# Patient Record
Sex: Female | Born: 1965 | Race: White | Hispanic: No | Smoking: Never smoker
Health system: Southern US, Community
[De-identification: ages and names within clinical notes are randomized; demographics above are authoritative.]

## PROBLEM LIST (undated history)

## (undated) HISTORY — PX: SHOULDER ARTHROSCOPY: SHX128

## (undated) HISTORY — PX: TONSILLECTOMY: SUR1361

---

## 2016-07-03 ENCOUNTER — Emergency Department (HOSPITAL_BASED_OUTPATIENT_CLINIC_OR_DEPARTMENT_OTHER): Payer: BC Managed Care – PPO

## 2016-07-03 ENCOUNTER — Emergency Department (HOSPITAL_BASED_OUTPATIENT_CLINIC_OR_DEPARTMENT_OTHER)
Admission: EM | Admit: 2016-07-03 | Discharge: 2016-07-03 | Disposition: A | Discharge status: Home / Self Care | Payer: BC Managed Care – PPO | Attending: Emergency Medicine | Admitting: Emergency Medicine

## 2016-07-03 ENCOUNTER — Encounter (HOSPITAL_BASED_OUTPATIENT_CLINIC_OR_DEPARTMENT_OTHER): Payer: Self-pay

## 2016-07-03 DIAGNOSIS — S8261XA Displaced fracture of lateral malleolus of right fibula, initial encounter for closed fracture: Secondary | ICD-10-CM | POA: Diagnosis not present

## 2016-07-03 DIAGNOSIS — Y999 Unspecified external cause status: Secondary | ICD-10-CM | POA: Diagnosis not present

## 2016-07-03 DIAGNOSIS — Y929 Unspecified place or not applicable: Secondary | ICD-10-CM | POA: Diagnosis not present

## 2016-07-03 DIAGNOSIS — S82891A Other fracture of right lower leg, initial encounter for closed fracture: Secondary | ICD-10-CM

## 2016-07-03 DIAGNOSIS — Y939 Activity, unspecified: Secondary | ICD-10-CM | POA: Insufficient documentation

## 2016-07-03 DIAGNOSIS — X501XXA Overexertion from prolonged static or awkward postures, initial encounter: Secondary | ICD-10-CM | POA: Diagnosis not present

## 2016-07-03 DIAGNOSIS — S99911A Unspecified injury of right ankle, initial encounter: Secondary | ICD-10-CM | POA: Diagnosis present

## 2016-07-03 MED ORDER — IBUPROFEN 800 MG PO TABS
800.0000 mg | ORAL_TABLET | Freq: Once | ORAL | Status: AC
  Administered 2016-07-03: 800 mg via ORAL
  Filled 2016-07-03: qty 1

## 2016-07-03 MED ORDER — ACETAMINOPHEN 500 MG PO TABS
1000.0000 mg | ORAL_TABLET | Freq: Once | ORAL | Status: AC
  Administered 2016-07-03: 1000 mg via ORAL
  Filled 2016-07-03: qty 2

## 2016-07-03 NOTE — ED Triage Notes (Signed)
Right ankle injury 1230pm today-NAD-slow limping gait

## 2016-07-03 NOTE — ED Provider Notes (Signed)
[facility]   CSN: [id number] Arrival date & time: 07/03/16  2009  By signing my name below, I, Linna Darner, attest that this documentation has been prepared under the direction and in the presence of physician practitioner, Melene Plan, DO. Electronically Signed: Linna Darner, Scribe. 07/03/2016. 8:25 PM.  History   Chief Complaint Chief Complaint  Patient presents with  . Ankle Injury   The history is provided by the patient. No language interpreter was used.  Ankle Injury  This is a new problem. The current episode started 1 to 2 hours ago. The problem occurs constantly. The problem has not changed since onset.Pertinent negatives include no chest pain, no abdominal pain, no headaches and no shortness of breath. The symptoms are aggravated by walking and standing. Nothing relieves the symptoms. She has tried nothing for the symptoms.    HPI Comments: Megan Mclean is a 51 y.o. female who presents to the Emergency Department complaining of a lateral right ankle injury sustained shortly PTA. She states she accidentally stepped in a hole and everted her right ankle. No falls. She endorses some associated swelling. She states her pain is worse with weight bearing and applied pressure to her lateral right ankle. No alleviating factors noted. She denies numbness/tingling, wounds, or any other associated symptoms.  History reviewed. No pertinent past medical history.  There are no active problems to display for this patient.   Past Surgical History:  Procedure Laterality Date  . CESAREAN SECTION      OB History    No data available       Home Medications    Prior to Admission medications   Not on File    Family History No family history on file.  Social History Social History  Substance Use Topics  . Smoking status: Never Smoker  . Smokeless tobacco: Never Used  . Alcohol use No     Allergies   Codeine   Review of Systems Review of  Systems  Respiratory: Negative for shortness of breath.   Cardiovascular: Negative for chest pain.  Gastrointestinal: Negative for abdominal pain.  Musculoskeletal: Positive for arthralgias and joint swelling.  Skin: Negative for wound.  Neurological: Negative for numbness and headaches.  All other systems reviewed and are negative.  Physical Exam Updated Vital Signs BP 133/89 (BP Location: Left Arm)   Pulse 97   Temp 98.3 F (36.8 C) (Oral)   Resp 20   Ht  (1.626 m)   Wt 170 lb (77.1 kg)   SpO2 100%   BMI 29.18 kg/m   Physical Exam  Constitutional: She is oriented to person, place, and time. She appears well-developed and well-nourished. No distress.  HENT:  Head: Normocephalic and atraumatic.  Eyes: EOM are normal. Pupils are equal, round, and reactive to light.  Neck: Normal range of motion. Neck supple.  Cardiovascular: Normal rate and regular rhythm.  Exam reveals no gallop and no friction rub.   No murmur heard. Pulmonary/Chest: Effort normal. She has no wheezes. She has no rales.  Abdominal: Soft. She exhibits no distension. There is no tenderness.  Musculoskeletal: She exhibits tenderness.  Pain about the right lateral malleolus. Focal swelling just inferior and posterior to the malleolus. Pain is worse about the attachment of the ATF. No pain to the base of the fifth metatarsal or the cuboid. No fibular neck tenderness.  Neurological: She is alert and oriented to person, place, and time.  Skin: Skin is warm and dry. She is  not diaphoretic.  Psychiatric: She has a normal mood and affect. Her behavior is normal.  Nursing note and vitals reviewed.  ED Treatments / Results  Labs (all labs ordered are listed, but only abnormal results are displayed) Labs Reviewed - No data to display  EKG  EKG Interpretation None       Radiology Dg Ankle Complete Right  Result Date: 07/03/2016 CLINICAL DATA:  Twisted ankle today, lateral pain and swelling. EXAM: RIGHT  ANKLE - COMPLETE 3+ VIEW COMPARISON:  None. FINDINGS: No dislocation. Slight cortical irregularity of the lateral malleolar tip. The ankle mortise appears congruent and the tibiofibular syndesmosis intact. No destructive bony lesions. Mild lateral hindfoot soft tissue swelling without subcutaneous gas or radiopaque foreign bodies. IMPRESSION: Slight cortical irregularity of the lateral malleolar tip equivocal for acute nondisplaced avulsion fracture. Electronically Signed   By: Awilda Metro M.D.   On: 07/03/2016 20:53    Procedures Procedures (including critical care time)  DIAGNOSTIC STUDIES: Oxygen Saturation is 100% on RA, normal by my interpretation.    COORDINATION OF CARE: 8:29 PM Discussed treatment plan with pt at bedside and pt agreed to plan.  Medications Ordered in ED Medications  acetaminophen (TYLENOL) tablet 1,000 mg (1,000 mg Oral Given 07/03/16 2047)  ibuprofen (ADVIL,MOTRIN) tablet 800 mg (800 mg Oral Given 07/03/16 2047)     Initial Impression / Assessment and Plan / ED Course  I have reviewed the triage vital signs and the nursing notes.  Pertinent labs & imaging results that were available during my care of the patient were reviewed by me and considered in my medical decision making (see chart for details).     52 yo F With a chief complaint of right ankle pain. This occurred after an inversion injury when she stepped into a hole. Patient has swelling focally just inferior and posterior to the lateral malleolus. Pain at the attachment of the ATF. X-ray consistent with ligamentous injury there is a small possible chip fracture were expect the attachment to be. We'll place the patient in a removable splint. Given crutches. PCP follow-up.  9:06 PM:  I have discussed the diagnosis/risks/treatment options with the patient and believe the pt to be eligible for discharge home to follow-up with PCP. We also discussed returning to the ED immediately if new or worsening sx  occur. We discussed the sx which are most concerning (e.g., sudden worsening pain, fever, inability to tolerate by mouth) that necessitate immediate return. Medications administered to the patient during their visit and any new prescriptions provided to the patient are listed below.  Medications given during this visit Medications  acetaminophen (TYLENOL) tablet 1,000 mg (1,000 mg Oral Given 07/03/16 2047)  ibuprofen (ADVIL,MOTRIN) tablet 800 mg (800 mg Oral Given 07/03/16 2047)     The patient appears reasonably screen and/or stabilized for discharge and I doubt any other medical condition or other [facility] requiring further screening, evaluation, or treatment in the ED at this time prior to discharge.    Final Clinical Impressions(s) / ED Diagnoses   Final diagnoses:  Closed fracture of malleolus of right ankle, initial encounter    New Prescriptions New Prescriptions   No medications on file    I personally performed the services described in this documentation, which was scribed in my presence. The recorded information has been reviewed and is accurate.    Melene Plan, DO 07/03/16 2106

## 2016-07-03 NOTE — Discharge Instructions (Signed)
Take 4 over the counter ibuprofen tablets 3 times a day or 2 over-the-counter naproxen tablets twice a day for pain. Also take tylenol 1000mg(2 extra strength) four times a day.    

## 2016-10-07 ENCOUNTER — Ambulatory Visit (INDEPENDENT_AMBULATORY_CARE_PROVIDER_SITE_OTHER): Payer: BC Managed Care – PPO | Admitting: Orthopaedic Surgery

## 2016-10-07 ENCOUNTER — Ambulatory Visit (INDEPENDENT_AMBULATORY_CARE_PROVIDER_SITE_OTHER): Payer: Self-pay

## 2016-10-07 ENCOUNTER — Encounter (INDEPENDENT_AMBULATORY_CARE_PROVIDER_SITE_OTHER): Payer: Self-pay | Admitting: Orthopaedic Surgery

## 2016-10-07 VITALS — BP 129/81 | HR 98 | Resp 14 | Ht 65.0 in | Wt 165.0 lb

## 2016-10-07 DIAGNOSIS — M542 Cervicalgia: Secondary | ICD-10-CM

## 2016-10-07 DIAGNOSIS — G8929 Other chronic pain: Secondary | ICD-10-CM

## 2016-10-07 DIAGNOSIS — M25512 Pain in left shoulder: Secondary | ICD-10-CM | POA: Diagnosis not present

## 2016-10-07 NOTE — Progress Notes (Signed)
Office Visit Note   Patient: Megan Mclean           Date of Birth: 1965/05/26           MRN: [id number] Visit Date: 10/07/2016              Requested by: [place], [place], [place] [address] [place], [place] [address] PCP: Piedad Climes, [place], [place]   Assessment & Plan: Visit Diagnoses: Left shoulder pain with evidence of calcific tendinitis. Positive impingement  Plan: MRI scan left shoulder. Return shortly thereafter. Suggest a formal chest x-ray to be performed to her primary care physician's office with evidence of scarring in the left upper lobe prior C-spine films. Follow up with ENT physicians regarding persistent dizziness and/or consider referral to neurologist. Discussed all the above with Megan Mclean  Follow-Up Instructions: No Follow-up on file.   Orders:  No orders of the defined types were placed in this encounter.  No orders of the defined types were placed in this encounter.     Procedures: No procedures performed   Clinical Data: No additional findings.   Subjective: Chief Complaint  Patient presents with  . Left Shoulder - Pain, Weakness    Megan Mclean is a 51 y o that presents with chronic Left shoulder pain x 2 yrs. Seen at [facility], frozen shoulder, gave in  's lemons visits the office today for essentially a second opinion regarding a chronic problem she is having with her left shoulder. She has been evaluated by an orthopedist in [facility]. Cortisone injection was "uncomfortable" but did not provide her with any significant relief. Pain is somewhat nondescript but oftentimes associated with overhead activity and lying on that side. She also was had an issue with her cervical spine associated with vertigo. She's been evaluated by her primary care physician and an ENT physician with physical therapy. She no longer has the vertigo but still is having some "dizziness". She does not experience numbness or tingling. She does work as a Runner, broadcasting/film/video  and is concerned about her returning to work in the near future with all of the above. She is really not sure where to turn and how problem with her neck and her shoulder may or may not be related. He's had some stiffness and achiness about the cervical spine was some referred discomfort along the left scapula. She's not sure that any motion of her neck refers the pain to the anterior aspect of her left shoulder where she is having more discomfort. She denies any fever or chills. She's not had any specific injury. Notes from her orthopedist and are reviewed. She's had prior x-rays revealing what they thought might be a loose body. No further diagnostic studies have been ordered this point. I did review the films performed in [facility]. I believe that that calcified area is a calcific tendinitis involving the supraspinatus. There are mild degenerative changes at the acromioclavicular joint. The humeral head is centered about the glenoid  HPI  Review of Systems  Constitutional: Negative for chills, fatigue and fever.  Eyes: Negative for itching.  Respiratory: Negative for chest tightness and shortness of breath.   Cardiovascular: Negative for chest pain, palpitations and leg swelling.  Gastrointestinal: Negative for blood in stool, constipation and diarrhea.  Musculoskeletal: Negative for back pain, joint swelling, neck pain and neck stiffness.  Neurological: Positive for dizziness. Negative for numbness.  Hematological: Does not bruise/bleed easily.  Psychiatric/Behavioral: The patient is not nervous/anxious.  Objective: Vital Signs: BP 129/81   Pulse 98   Resp 14   Ht 5\' 5"  (1.651 m)   Wt 165 lb (74.8 kg)   BMI 27.46 kg/m   Physical Exam  Ortho Exam left shoulder with positive impingement on the extremes of internal/external rotation. Minimally positive empty can test. Able to raise her left arm fully overhead but with a circuitous motion. Some tenderness along the anterior and  lateral subacromial region. Skin intact. Biceps intact. Good grip and good release. Reflexes symmetrical. Some limitation of the cervical spine in flexion extension and rotation loss is mild. Some referred pain to the left scapula with motion and to the posterior aspect of the cervical spine. With rotation of the neck to the to the right there was some discomfort to the shoulder. Some areas of tenderness about the left scapula but  no mass. Pain about the left shoulder and specifically supraspinatus was mild.  Specialty Comments:  No specialty comments available.  Imaging: No results found.   PMFS History: There are no active problems to display for this patient.  History reviewed. No pertinent past medical history.  Family History  Problem Relation Age of Onset  . Cancer Sister   . Osteosarcoma Sister   . Osteosarcoma Son     Past Surgical History:  Procedure Laterality Date  . CESAREAN SECTION     Social History   Occupational History  . Not on file.   Social History Main Topics  . Smoking status: Never Smoker  . Smokeless tobacco: Never Used  . Alcohol use No  . Drug use: No  . Sexual activity: Not on file

## 2016-10-09 ENCOUNTER — Telehealth (INDEPENDENT_AMBULATORY_CARE_PROVIDER_SITE_OTHER): Payer: Self-pay | Admitting: Orthopaedic Surgery

## 2016-10-09 NOTE — Telephone Encounter (Signed)
Patient wants to check on status of MRI, and Chest Xray that was suppose to be ordered. Patient hasn't heard anything. Please call to advise.

## 2016-10-10 NOTE — Telephone Encounter (Signed)
scheduled

## 2016-10-12 ENCOUNTER — Other Ambulatory Visit: Payer: BC Managed Care – PPO

## 2016-11-01 ENCOUNTER — Ambulatory Visit
Admission: RE | Admit: 2016-11-01 | Discharge: 2016-11-01 | Disposition: A | Discharge status: Home / Self Care | Payer: BC Managed Care – PPO | Source: Ambulatory Visit | Attending: Orthopaedic Surgery | Admitting: Orthopaedic Surgery

## 2016-11-01 DIAGNOSIS — G8929 Other chronic pain: Secondary | ICD-10-CM

## 2016-11-01 DIAGNOSIS — M542 Cervicalgia: Secondary | ICD-10-CM

## 2016-11-01 DIAGNOSIS — M25512 Pain in left shoulder: Secondary | ICD-10-CM

## 2016-11-18 ENCOUNTER — Ambulatory Visit (INDEPENDENT_AMBULATORY_CARE_PROVIDER_SITE_OTHER): Payer: BC Managed Care – PPO | Admitting: Orthopaedic Surgery

## 2016-12-09 ENCOUNTER — Ambulatory Visit (INDEPENDENT_AMBULATORY_CARE_PROVIDER_SITE_OTHER): Payer: BC Managed Care – PPO | Admitting: Orthopaedic Surgery

## 2016-12-09 ENCOUNTER — Encounter (INDEPENDENT_AMBULATORY_CARE_PROVIDER_SITE_OTHER): Payer: Self-pay | Admitting: Orthopaedic Surgery

## 2016-12-09 VITALS — BP 130/83 | HR 89 | Resp 12 | Ht 64.0 in | Wt 175.0 lb

## 2016-12-09 DIAGNOSIS — M25512 Pain in left shoulder: Secondary | ICD-10-CM | POA: Diagnosis not present

## 2016-12-09 DIAGNOSIS — G8929 Other chronic pain: Secondary | ICD-10-CM

## 2016-12-09 NOTE — Progress Notes (Signed)
Office Visit Note   Patient: Megan Mclean           Date of Birth: 1965/06/23           MRN: [id number] Visit Date: 12/09/2016              Requested by: [place], [place], [place] [address] [place], [place] [address] PCP: Piedad Climes, [place], [place]   Assessment & Plan: Visit Diagnoses:  1. Chronic left shoulder pain   Chronic impingement syndrome left shoulder. MRI scan demonstrates a high-grade partial bursal surface tear of the supraspinatus with areas of calcium deposition consistent with calcific tendinitis. Type II acromion. Mild degenerative changes at the acromioclavicular joint and glenohumeral joint  Plan: Long discussion regarding different treatment options. Megan Mclean has been experiencing trouble for over a year and a half. She's had. Cortisone injections, time, anti-inflammatory medicines with compromise of her activities. I think the next best approach to be an arthroscopic subacromial decompression and see if we can't decompress the area of calcific tendinitis. Require a small mini incision. I discussed this in detail with her. At this point I would probably leave the acromioclavicular joint alone.  Long discussion regarding the surgery, outpatient nature, what she may expect. Certainly is a possibly show some residual pain. She knees look at her schedule and get back with [place]. I did offer physical therapy and she did not want to consider that   Orders:  No orders of the defined types were placed in this encounter.  No orders of the defined types were placed in this encounter.     Procedures: No procedures performed   Clinical Data: No additional findings.   Subjective: Chief Complaint  Patient presents with  . Left Shoulder - Results    Megan Mclean is a 51 y o  Here for MRI results of L shoulder  And has been experiencing pain in her left shoulder for over a year and a half. Predominant pain is along the anterior aspect of her shoulder.  Because of her poor response to time and conservative treatment measures we've ordered an MRI scan. This scan demonstrates a high-grade partial-thickness bursal surface tearing of the distal posterior supraspinatus with about a 1 x 0.9 x 0.7 cm low signal intensity near the bursal margin of the tender tendon favoring calcific tendinopathy there was moderate adjacent edema and expansion of the supraspinatus tendon compatible with calcific tendinopathy. A type II acromion is an abnormal amount of fluid the subacromial and subdeltoid bursa. There was mild spurring is minimal subcortical marrow edema at the before meals joint and mild degenerative chondral thinning and mild spurring at the glenohumeral joint  HPI  Review of Systems  Constitutional: Negative for chills, fatigue and fever.  HENT: Positive for ear pain.   Eyes: Negative for itching.  Respiratory: Negative for chest tightness and shortness of breath.   Cardiovascular: Negative for chest pain, palpitations and leg swelling.  Gastrointestinal: Negative for blood in stool, constipation and diarrhea.  Endocrine: Negative for polyuria.  Genitourinary: Negative for dysuria.  Musculoskeletal: Negative for back pain, joint swelling, neck pain and neck stiffness.  Allergic/Immunologic: Negative for immunocompromised state.  Neurological: Positive for dizziness. Negative for numbness.  Hematological: Does not bruise/bleed easily.  Psychiatric/Behavioral: Positive for sleep disturbance. The patient is not nervous/anxious.      Objective: Vital Signs: BP 130/83   Pulse 89   Resp 12   Ht  (1.626 m)   Wt 175  lb (79.4 kg)   BMI 30.04 kg/m   Physical Exam awake alert and oriented 3. Comfortable sitting. Local tenderness over the anterior aspect of the shoulder near the supraspinatus. No crepitation. No pain at the acromioclavicular joint. No pain along the biceps tendon. Negative speed sign. Good strength. Pain and extraoral rotation in  the impingement position. Positive empty can testing.  Ortho Exam  Specialty Comments:  No specialty comments available.  Imaging: No results found.   PMFS History: There are no active problems to display for this patient.  No past medical history on file.  Family History  Problem Relation Age of Onset  . Cancer Sister   . Osteosarcoma Sister   . Osteosarcoma Son     Past Surgical History:  Procedure Laterality Date  . CESAREAN SECTION     Social History   Occupational History  . Not on file.   Social History Main Topics  . Smoking status: Never Smoker  . Smokeless tobacco: Never Used  . Alcohol use No  . Drug use: No  . Sexual activity: Not on file

## 2017-02-20 DIAGNOSIS — M75112 Incomplete rotator cuff tear or rupture of left shoulder, not specified as traumatic: Secondary | ICD-10-CM | POA: Diagnosis not present

## 2017-02-20 DIAGNOSIS — M7542 Impingement syndrome of left shoulder: Secondary | ICD-10-CM | POA: Diagnosis not present

## 2017-02-20 DIAGNOSIS — M19012 Primary osteoarthritis, left shoulder: Secondary | ICD-10-CM

## 2017-02-28 ENCOUNTER — Encounter (INDEPENDENT_AMBULATORY_CARE_PROVIDER_SITE_OTHER): Payer: Self-pay | Admitting: Orthopaedic Surgery

## 2017-02-28 ENCOUNTER — Ambulatory Visit (INDEPENDENT_AMBULATORY_CARE_PROVIDER_SITE_OTHER): Payer: BC Managed Care – PPO | Admitting: Orthopaedic Surgery

## 2017-02-28 VITALS — BP 124/77 | HR 86 | Resp 16 | Ht 64.0 in | Wt 175.0 lb

## 2017-02-28 DIAGNOSIS — M25512 Pain in left shoulder: Secondary | ICD-10-CM

## 2017-02-28 DIAGNOSIS — G8929 Other chronic pain: Secondary | ICD-10-CM

## 2017-02-28 NOTE — Progress Notes (Signed)
   Office Visit Note   Patient: Megan Mclean           Date of Birth: 02/06/1966           MRN: [id number] Visit Date: 02/28/2017              Requested by: [place], [place], [place] [id number] [place], [place] [id number] PCP: Piedad Climes[place], [place], [place]   Assessment & Plan: Visit Diagnoses:  1. Chronic left shoulder pain     Plan: One week status post arthroscopic distal clavicle resection and partial acromioplasty left shoulder. I also performed a mini open excision of calcific deposit from the supraspinatus with repair of a partial bursal surface tear. Doing well. We will start outpatient therapy have her return in 2 weeks no work until. Will start range of motion exercises Follow-Up Instructions: Return in about 2 weeks (around 03/14/2017).   Orders:  No orders of the defined types were placed in this encounter.  No orders of the defined types were placed in this encounter.     Procedures: No procedures performed   Clinical Data: No additional findings.   Subjective: Chief Complaint  Patient presents with  . Left Shoulder - Routine Post Op  . Post-op Follow-up    Doing ok  Denies shortness of breath, chest pain fever or chills  HPI  Review of Systems   Objective: Vital Signs: BP 124/77 (BP Location: Right Arm, Patient Position: Sitting, Cuff Size: Normal)   Pulse 86   Resp 16   Ht 5\' 4"  (1.626 m)   Wt 175 lb (79.4 kg)   BMI 30.04 kg/m   Physical Exam  Ortho Exam awake alert and oriented 3. Comfortable sitting. Dressing removed from left shoulder. Wounds look fine without evidence of infection his Steri-Strips are applied. Neurovascular exam intact  Specialty Comments:  No specialty comments available.  Imaging: No results found.   PMFS History: There are no active problems to display for this patient.  History reviewed. No pertinent past medical history.  Family History  Problem Relation Age of Onset  . Cancer Sister   .  Osteosarcoma Sister   . Osteosarcoma Son     Past Surgical History:  Procedure Laterality Date  . CESAREAN SECTION    . SHOULDER ARTHROSCOPY    . TONSILLECTOMY     Social History   Occupational History  . Not on file  Tobacco Use  . Smoking status: Never Smoker  . Smokeless tobacco: Never Used  Substance and Sexual Activity  . Alcohol use: No  . Drug use: No  . Sexual activity: Not on file     Valeria BatmanPeter W Ramsay Bognar, MD   Note - This record has been created using [employer] software.  Chart creation errors have been sought, but may not always  have been located. Such creation errors do not reflect on  the standard of medical care.

## 2017-03-07 ENCOUNTER — Inpatient Hospital Stay (INDEPENDENT_AMBULATORY_CARE_PROVIDER_SITE_OTHER): Payer: BC Managed Care – PPO | Admitting: Orthopaedic Surgery

## 2017-03-11 ENCOUNTER — Ambulatory Visit: Payer: BC Managed Care – PPO | Attending: Orthopaedic Surgery | Admitting: Physical Therapy

## 2017-03-11 ENCOUNTER — Other Ambulatory Visit: Payer: Self-pay

## 2017-03-11 DIAGNOSIS — M25512 Pain in left shoulder: Secondary | ICD-10-CM | POA: Insufficient documentation

## 2017-03-11 DIAGNOSIS — G8929 Other chronic pain: Secondary | ICD-10-CM | POA: Diagnosis present

## 2017-03-11 DIAGNOSIS — M6281 Muscle weakness (generalized): Secondary | ICD-10-CM

## 2017-03-11 DIAGNOSIS — M25612 Stiffness of left shoulder, not elsewhere classified: Secondary | ICD-10-CM | POA: Diagnosis present

## 2017-03-11 DIAGNOSIS — R293 Abnormal posture: Secondary | ICD-10-CM | POA: Diagnosis present

## 2017-03-12 NOTE — Therapy (Addendum)
[facility] Outpatient Rehabilitation [facility] [address]  [address] [place], [place], [address] Phone: [phone]   Fax:  [phone]  Physical Therapy Evaluation  Patient Details  Name: Megan Mclean MRN: [id number] Date of Birth: 1965/07/18 Referring Provider: Anthony Sar, MD   Encounter Date: 03/11/2017  PT End of Session - 03/11/17 1358    Visit Number  1    Number of Visits  16    Date for PT Re-Evaluation  05/09/17    Authorization Type  State BCBS & Medicaid    PT Start Time  1358    PT Stop Time  1448    PT Time Calculation (min)  50 min    Activity Tolerance  Patient tolerated treatment well    Behavior During Therapy  [facility] for tasks assessed/performed       No past medical history on file.  Past Surgical History:  Procedure Laterality Date  . CESAREAN SECTION    . SHOULDER ARTHROSCOPY    . TONSILLECTOMY      There were no vitals filed for this visit.   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    Pertinent History  02/20/17 - L shoulder arthroscopy: Distal clavicle resection and partial acromioplasty left shoulder; mini open excision of calcific deposit from the supraspinatus with repair of a partial bursal surface tear.    Patient Stated Goals  "more mobility/use of L arm"    Currently in Pain?  Yes    Pain Score  -- 2-3/10; up to 5-6/10 at worst    Pain Location  Shoulder    Pain Orientation  Left;Anterior;Lateral    Pain Descriptors / Indicators  Burning;Pressure;Tightness    Pain Type  Acute pain    Pain Radiating Towards  intermittent shooting pain to L elbow    Pain Onset  More than a month ago    Pain Frequency  Constant    Aggravating Factors   any use of L UE    Pain Relieving Factors  ice, ibuprofen    Effect of Pain on Daily Activities  unable  to lift or reach with L UE         [facility] PT Assessment - 03/12/17 0001      Assessment   Medical Diagnosis  L shoudler arthoscopy    Onset Date/Surgical Date  02/20/17    Hand Dominance  Right    Next MD Visit  03/14/17      Precautions   Required Braces or Orthoses  Sling for comfort PRN      Balance Screen   Has the patient fallen in the past 6 months  No    Has the patient had a decrease in activity level because of a fear of falling?   No    Is the patient reluctant to leave their home because of a fear of falling?   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    Leisure  baking, cooking      Observation/Other Assessments   Focus  on Therapeutic Outcomes (FOTO)   Shoulder 42% (58% limitation); predicted 68% (32% limitation) in 15 visits      Posture/Postural Control   Posture/Postural Control  Postural limitations    Postural Limitations  Forward head;Rounded Shoulders      ROM / Strength   AROM / PROM / Strength  AROM;PROM;Strength      AROM   Right Shoulder Flexion  170 Degrees    Right Shoulder ABduction  176 Degrees    Right Shoulder Internal Rotation  -- FIR T5    Right Shoulder External Rotation  -- FER to T4    Left Shoulder Flexion  68 Degrees    Left Shoulder ABduction  47 Degrees    Left Shoulder Internal Rotation  -- FIR to scarum    Left Shoulder External Rotation  -- FER - unable      PROM   Left Shoulder Flexion  91 Degrees    Left Shoulder ABduction  78 Degrees    Left Shoulder Internal Rotation  61 Degrees    Left Shoulder External Rotation  24 Degrees      Strength   Right Shoulder Flexion  4+/5    Right Shoulder ABduction  4/5    Right Shoulder Internal Rotation  4-/5    Right Shoulder External Rotation  4-/5      Palpation   Palpation comment   increased muscle tension in L ant/mid deltoid, lateral pecs & posterior capsule             Objective measurements completed on examination: See above findings.      [facility] Adult PT Treatment/Exercise - 03/11/17 1358      Exercises   Exercises  Shoulder      Shoulder Exercises: Supine   External Rotation  Left;AAROM;10 reps    External Rotation Limitations  wand with towel roll under upper arm    Flexion  Left;AAROM;10 reps    Flexion Limitations  wand      Shoulder Exercises: Seated   Retraction  Both;10 reps    Retraction Limitations  5" hold - retraction + depression      Shoulder Exercises: ROM/Strengthening   Pendulum  L shoulder flex/ext, horiz ABD/ADD & circles             PT Education - 03/11/17 1447    Education provided  Yes    Education Details  Pt eval findings, anticipated POC & initial HEP    Person(s) Educated  Patient    Methods  Explanation;Demonstration;Verbal cues;Handout    Comprehension  Verbalized understanding;Returned demonstration;Verbal cues required;Need further instruction       PT Short Term Goals - 03/11/17 1448      PT SHORT TERM GOAL #1   Title  Independent with initial HEP    Status  New    Target Date  04/04/17      PT SHORT TERM GOAL #2   Title  Pt will verbalize understanding of neutral spine and shoudler posture to allow improved glenohumeral mechanics    Status  New    Target Date  04/04/17      PT SHORT TERM GOAL #3   Title  L shoudler PROM w/in 10-15 dg of R shoulder AROM    Status  New    Target Date  04/04/17        PT Long Term Goals - 03/11/17 1448      PT LONG TERM GOAL #1   Title  Independent with ongoing  HEP +/- gym program    Status  New    Target Date  05/09/17      PT LONG TERM GOAL #2   Title  L shoulder AROM WFL w/o increased pain     Status  New    Target Date  05/09/17      PT LONG TERM GOAL #3   Title  L shoulder strength >/= 4-/5 for improved functional use of L UE    Status  New     Target Date  05/09/17      PT LONG TERM GOAL #4   Title  Pt will report ability to complete ADLs, light household chores and typical job tasks w/o limitation due to L shoulder pain, LOM or weakness    Status  New    Target Date  05/09/17             Plan - 03/11/17 1448    Clinical Impression Statement  Megan Mclean is a 52 y/o female who presents to OP PT s/p L shoulder arthroscopy on 02/20/17 for distal clavicle resection and partial acromioplasty left shoulder; mini open excision of calcific deposit from the supraspinatus with repair of a partial bursal surface tear. Pt reports h/o chronic pain in L shoulder with previously diagnosed adhesive capsulitis ~1 yr ago. Pain currently 2-3/10 but increases to 5-6/10 with attempts to use L UE. Pt demonstrates postural abnormalities, severely limited L shoulder AROM & PROM with severe weakness noted for all motions of L shoulder preventing functional use of L shoulder with all ADLs, household chores and work tasks. Pt will benefit from skilled PT to address deficits listed.    History and Personal Factors relevant to plan of care:  chronic L shoudler pain; h/o L shoulder adhesive capsulitis ~1 yr ago    Clinical Presentation  Evolving    Clinical Presentation due to:  h/o L shoulder adhesive capsulitis with high risk for recurrent adhesive capsulitis    Clinical Decision Making  Moderate    Rehab Potential  Good    Clinical Impairments Affecting Rehab Potential  chronic L shoudler pain; h/o L shoulder adhesive capsulitis ~1 yr ago    PT Frequency  2x / week    PT Duration  8 weeks    PT Treatment/Interventions  Patient/family education;ADLs/Self Care Home Management;Neuromuscular re-education;Therapeutic exercise;Therapeutic activities;Manual techniques;Passive range of motion;Scar mobilization;Taping;Dry needling;Electrical Stimulation;Moist Heat;Cryotherapy;Vasopneumatic Device;Ultrasound;Iontophoresis 4mg /ml Dexamethasone    Consulted and Agree  with Plan of Care  Patient       Patient will benefit from skilled therapeutic intervention in order to improve the following deficits and impairments:  Pain, Decreased range of motion, Decreased strength, Impaired flexibility, Increased muscle spasms, Decreased scar mobility, Postural dysfunction, Decreased activity tolerance, Impaired UE functional use  Visit Diagnosis: Chronic left shoulder pain - Plan: PT plan of care cert/re-cert  Stiffness of left shoulder, not elsewhere classified - Plan: PT plan of care cert/re-cert  Muscle weakness (generalized) - Plan: PT plan of care cert/re-cert  Abnormal posture - Plan: PT plan of care cert/re-cert     Problem List There are no active problems to display for this patient.   Megan Mclean, PT, MPT 03/12/2017, 12:48 PM  [facility] [place] [address]  [address] [place], [place], [address] Phone: [phone]   Fax:  [phone]  Name: Megan Mclean MRN: [id number] Date of Birth: 04/23/1965

## 2017-03-14 ENCOUNTER — Encounter (INDEPENDENT_AMBULATORY_CARE_PROVIDER_SITE_OTHER): Payer: Self-pay | Admitting: Orthopaedic Surgery

## 2017-03-14 ENCOUNTER — Ambulatory Visit (INDEPENDENT_AMBULATORY_CARE_PROVIDER_SITE_OTHER): Payer: BC Managed Care – PPO | Admitting: Orthopaedic Surgery

## 2017-03-14 VITALS — BP 122/85 | HR 83 | Ht 64.0 in | Wt 178.0 lb

## 2017-03-14 DIAGNOSIS — G8929 Other chronic pain: Secondary | ICD-10-CM

## 2017-03-14 DIAGNOSIS — M25512 Pain in left shoulder: Secondary | ICD-10-CM

## 2017-03-14 NOTE — Progress Notes (Signed)
   Office Visit Note   Patient: Megan Mclean           Date of Birth: 06/14/1965           MRN: [id number] Visit Date: 03/14/2017              Requested by: [place], [place], [place] [id number] [place], [place] [address] PCP: Piedad ClimesFulbright, [place] E, [place]   Assessment & Plan: Visit Diagnoses:  1. Chronic left shoulder pain     Plan: 3 weeks status post arthroscopic subacromial decompression, distal clavicle resection and mini open lateral of calcific tendinitis left shoulder. Does have some loss of motion with early adhesive capsulitis. Used to work with physical therapy and home exercise program and we'll see back in 3 weeks. Long discussion regarding limited range of motion and need to work with exercises  Follow-Up Instructions: Return in about 3 weeks (around 04/04/2017).   Orders:  No orders of the defined types were placed in this encounter.  No orders of the defined types were placed in this encounter.     Procedures: No procedures performed   Clinical Data: No additional findings.   Subjective: Chief Complaint  Patient presents with  . Follow-up    2 WK FU STILL SORE  3 weeks post left shoulder surgery as mentioned above. Has developing early adhesive capsulitis with limited motion. Hopefully with physical therapy this will resolve. No fever or chills. We'll give her a note to return to work on Monday  HPI  Review of Systems  Constitutional: Negative.   HENT: Negative.   Eyes: Negative.   Respiratory: Negative.   Cardiovascular: Negative.   Gastrointestinal: Negative.   Genitourinary: Negative.   Musculoskeletal: Positive for neck pain.  Skin: Negative.   Neurological: Negative.      Objective: Vital Signs: BP 122/85 (BP Location: Right Arm, Patient Position: Sitting, Cuff Size: Normal)   Pulse 83   Ht 5\' 4"  (1.626 m)   Wt 178 lb (80.7 kg)   BMI 30.55 kg/m   Physical Exam  Ortho Exam awake alert and oriented 3 comfortable sitting.  Incisions left shoulder resolve. I was only able to abduct about 70 and flex about 130 at which point shoulder was tight. Only had about 10 of external rotation. Otherwise shoulder was not painful. Good grip and release. Neurovascular exam intact  Specialty Comments:  No specialty comments available.  Imaging: No results found.   PMFS History: There are no active problems to display for this patient.  History reviewed. No pertinent past medical history.  Family History  Problem Relation Age of Onset  . Cancer Sister   . Osteosarcoma Sister   . Osteosarcoma Son     Past Surgical History:  Procedure Laterality Date  . CESAREAN SECTION    . SHOULDER ARTHROSCOPY    . TONSILLECTOMY     Social History   Occupational History  . Not on file  Tobacco Use  . Smoking status: Never Smoker  . Smokeless tobacco: Never Used  Substance and Sexual Activity  . Alcohol use: No  . Drug use: No  . Sexual activity: Not on file

## 2017-03-17 ENCOUNTER — Ambulatory Visit: Payer: BC Managed Care – PPO

## 2017-03-18 ENCOUNTER — Ambulatory Visit: Payer: BC Managed Care – PPO

## 2017-03-18 DIAGNOSIS — M25512 Pain in left shoulder: Secondary | ICD-10-CM | POA: Diagnosis not present

## 2017-03-18 DIAGNOSIS — R293 Abnormal posture: Secondary | ICD-10-CM

## 2017-03-18 DIAGNOSIS — G8929 Other chronic pain: Secondary | ICD-10-CM

## 2017-03-18 DIAGNOSIS — M6281 Muscle weakness (generalized): Secondary | ICD-10-CM

## 2017-03-18 DIAGNOSIS — M25612 Stiffness of left shoulder, not elsewhere classified: Secondary | ICD-10-CM

## 2017-03-18 NOTE — Therapy (Signed)
[facility] Outpatient Rehabilitation [facility] [address]  [address] [place], [place], [address] Phone: [phone]   Fax:  [phone]  Physical Therapy Treatment  Patient Details  Name: Megan Mclean MRN: [id number] Date of Birth: Jul 14, 1965 Referring Provider: Anthony Sar, MD   Encounter Date: 03/18/2017  PT End of Session - 03/18/17 0939    Visit Number  2    Number of Visits  16    Date for PT Re-Evaluation  05/09/17    Authorization Type  State BCBS & Medicaid    PT Start Time  0935    PT Stop Time  1015    PT Time Calculation (min)  40 min    Activity Tolerance  Patient tolerated treatment well    Behavior During Therapy  [facility] for tasks assessed/performed       No past medical history on file.  Past Surgical History:  Procedure Laterality Date  . CESAREAN SECTION    . SHOULDER ARTHROSCOPY    . TONSILLECTOMY      There were no vitals filed for this visit.  Subjective Assessment - 03/18/17 0936    Subjective  Pt. reporting had to work full day yesterday with increased L shoulder pain and some anterior neck pain today.  Reports using sling some for comfort over past two days.      Pertinent History  02/20/17 - L shoulder arthroscopy: Distal clavicle resection and partial acromioplasty left shoulder; mini open excision of calcific deposit from the supraspinatus with repair of a partial bursal surface tear.    Patient Stated Goals  "more mobility/use of L arm"    Currently in Pain?  Yes    Pain Score  4     Pain Location  Shoulder    Pain Orientation  Left;Anterior    Pain Descriptors / Indicators  Burning    Pain Type  Acute pain    Pain Radiating Towards  intermittent shooting pain to L elbow     Pain Onset  More than a month ago    Pain Frequency  Constant    Aggravating Factors   using L UE    Pain Relieving Factors  ibuprofen     Effect of Pain on Daily Activities  reaching overhead     Multiple Pain Sites  No                       [facility] Adult PT Treatment/Exercise - 03/18/17 0944      Self-Care   Self-Care  Other Self-Care Comments    Other Self-Care Comments   Discussed using washcloth for desensitization around L shoulder incisions as pt. noting hypersensitivity to clothing rubbing area      Shoulder Exercises: Supine   External Rotation  Left;AAROM;10 reps    External Rotation Limitations  wand with towel roll under upper arm visible improvement in ROM     Flexion  Left;AAROM;10 reps    Flexion Limitations  wand increased soreness at end range; visible improved ROM     Other Supine Exercises  L shoulder flexion AAROM with self-assist with R UE 3" x 10 reps       Shoulder Exercises: Seated   Retraction  Both;10 reps    Retraction Limitations  5" hold - retraction + depression      Shoulder Exercises: Standing   Flexion  Left;5 reps 5" stretch     Flexion Limitations  finger ladder     ABduction  Left;5 reps 5" stretch     ABduction Limitations  finder ladder     Row  10 reps;Theraband    Theraband Level (Shoulder Row)  Level 2 (Red)    Row Limitations  tactile cueing for full scap. retraction       Shoulder Exercises: Pulleys   Flexion  1 minute    ABduction  1 minute      Shoulder Exercises: ROM/Strengthening   Pendulum  L shoulder flex/ext, horiz ABD/ADD & circles heavy cueing for proper technique       Shoulder Exercises: Stretch   Other Shoulder Stretches  L UT stretch seated x 20 sec       Manual Therapy   Manual Therapy  Soft tissue mobilization;Passive ROM    Manual therapy comments  supine     Soft tissue mobilization  STM to L shoulder complex to promote muscular relaxation as pt. somewhat guarded     Passive ROM  L shoulder PROM all dir - focusing on ER, abduction, flexion             PT Education - 03/18/17 1540    Education provided  Yes    Education Details  UT stretch     Person(s) Educated  Patient    Methods   Explanation;Demonstration;Verbal cues;Handout    Comprehension  Verbalized understanding;Returned demonstration;Verbal cues required;Need further instruction       PT Short Term Goals - 03/18/17 0940      PT SHORT TERM GOAL #1   Title  Independent with initial HEP    Status  On-going      PT SHORT TERM GOAL #2   Title  Pt will verbalize understanding of neutral spine and shoudler posture to allow improved glenohumeral mechanics    Status  On-going      PT SHORT TERM GOAL #3   Title  L shoudler PROM w/in 10-15 dg of R shoulder AROM    Status  On-going        PT Long Term Goals - 03/18/17 0940      PT LONG TERM GOAL #1   Title  Independent with ongoing HEP +/- gym program    Status  On-going      PT LONG TERM GOAL #2   Title  L shoulder AROM WFL w/o increased pain     Status  On-going      PT LONG TERM GOAL #3   Title  L shoulder strength >/= 4-/5 for improved functional use of L UE    Status  On-going      PT LONG TERM GOAL #4   Title  Pt will report ability to complete ADLs, light household chores and typical job tasks w/o limitation due to L shoulder pain, LOM or weakness    Status  On-going            Plan - 03/18/17 0940    Clinical Impression Statement  Pt. reporting she worked full work shift yesterday and has had increased L shoulder pain since then.  Has been wearing sling for comfort last few days.  Has only performed HEP wand activities at home thus far however demonstrating visible improvement in ROM today in treatment.  Treatment focused on gentle AAROM activities and passive stretching to pt. tolerance for improved ROM.  Ended treatment with mild decrease in reported pain levels.  Will continues to progress ROM activities in coming visits.      Clinical Impairments Affecting Rehab Potential  chronic  L shoudler pain; h/o L shoulder adhesive capsulitis ~1 yr ago    PT Treatment/Interventions  Patient/family education;ADLs/Self Care Home  Management;Neuromuscular re-education;Therapeutic exercise;Therapeutic activities;Manual techniques;Passive range of motion;Scar mobilization;Taping;Dry needling;Electrical Stimulation;Moist Heat;Cryotherapy;Vasopneumatic Device;Ultrasound;Iontophoresis 4mg /ml Dexamethasone    Consulted and Agree with Plan of Care  Patient       Patient will benefit from skilled therapeutic intervention in order to improve the following deficits and impairments:  Pain, Decreased range of motion, Decreased strength, Impaired flexibility, Increased muscle spasms, Decreased scar mobility, Postural dysfunction, Decreased activity tolerance, Impaired UE functional use  Visit Diagnosis: Chronic left shoulder pain  Stiffness of left shoulder, not elsewhere classified  Muscle weakness (generalized)  Abnormal posture     Problem List There are no active problems to display for this patient.   Kermit BaloMicah Angeleena Dueitt, PTA 03/19/17 8:23 AM   [facility] [place] [address]  [address] [place], [place], [address] Phone: [phone]   Fax:  [phone]  Name: Megan ArnoldMelissa Mclean MRN: [id number] Date of Birth: 05/19/1965

## 2017-03-19 ENCOUNTER — Ambulatory Visit: Payer: BC Managed Care – PPO

## 2017-03-20 ENCOUNTER — Ambulatory Visit (INDEPENDENT_AMBULATORY_CARE_PROVIDER_SITE_OTHER): Payer: BC Managed Care – PPO | Admitting: Orthopaedic Surgery

## 2017-03-20 ENCOUNTER — Encounter (INDEPENDENT_AMBULATORY_CARE_PROVIDER_SITE_OTHER): Payer: Self-pay | Admitting: Orthopaedic Surgery

## 2017-03-20 VITALS — BP 125/77 | HR 77 | Resp 12 | Ht 64.0 in | Wt 165.0 lb

## 2017-03-20 DIAGNOSIS — M542 Cervicalgia: Secondary | ICD-10-CM

## 2017-03-20 NOTE — Progress Notes (Signed)
Office Visit Note   Patient: Megan Mclean           Date of Birth: 01/15/1966           MRN: [id number] Visit Date: 03/20/2017              Requested by: [place], [place], [place] [id number] [place], [place] [address] PCP: Piedad ClimesFulbright, [place], [place]   Assessment & Plan: Visit Diagnoses:  1. Cervicalgia     Plan: Approximate months status post left shoulder surgery involving SCD, DCR and excision of calcification of the supraspinatus tendon. Recently developed some pain along the anterior aspect of her neck after "talking a long time. She feels like the front of her neck is somewhat heavy and  thick and was concerned that it may be related to her shoulder. I don't think there is any association but I  suggested she check with her family physician. See back as scheduled for her left shoulder. She relates that her shoulder is feeling better  Follow-Up Instructions: Return as scheduled.   Orders:  No orders of the defined types were placed in this encounter.  No orders of the defined types were placed in this encounter.     Procedures: No procedures performed   Clinical Data: No additional findings.   Subjective: Chief Complaint  Patient presents with  . Neck - Sore Throat, Pain    Megan Mclean is a 52 y o here today for neck/throat pain x  6 days. She has a "lump" in her throat.  Swelling and burning  Insidious onset after "talking a lot" at her school. No fever chills or difficulty swallowing. Pain is localized along the anterior aspect of her neck. She was concerned that it may be related to her shoulder surgery of a month ago as mentioned above she is progressing nicely with therapy in regards to her left shoulder.  HPI  Review of Systems  Constitutional: Negative for chills, fatigue and fever.  Eyes: Negative for itching.  Respiratory: Negative for chest tightness and shortness of breath.   Cardiovascular: Negative for chest pain, palpitations and  leg swelling.  Gastrointestinal: Negative for blood in stool, constipation and diarrhea.  Endocrine: Negative for polyuria.  Genitourinary: Negative for dysuria.  Musculoskeletal: Positive for neck pain and neck stiffness. Negative for back pain and joint swelling.  Allergic/Immunologic: Negative for immunocompromised state.  Neurological: Positive for headaches. Negative for dizziness and numbness.  Hematological: Does not bruise/bleed easily.  Psychiatric/Behavioral: The patient is nervous/anxious.      Objective: Vital Signs: BP 125/77   Pulse 77   Resp 12   Ht 5\' 4"  (1.626 m)   Wt 165 lb (74.8 kg)   BMI 28.32 kg/m   Physical Exam  Ortho Exam negative impingement testing left shoulder. I was able to place her arm almost fully overhead slowly. Still has some weakness, probably related to the inactivity and surgery. Neurovascular exam intact. No pain with range of motion of the cervical spine. No significant loss of motion. She didn't really have any tenderness along the anterior aspect of her neck, but there was a little bit of fullness in the area of the hyoid bone. Also some in the area of her thyroid. I've suggested she check with her family physician  Specialty Comments:  No specialty comments available.  Imaging: No results found.   PMFS History: There are no active problems to display for this patient.  History reviewed. No pertinent past  medical history.  Family History  Problem Relation Age of Onset  . Cancer Sister   . Osteosarcoma Sister   . Osteosarcoma Son     Past Surgical History:  Procedure Laterality Date  . CESAREAN SECTION    . SHOULDER ARTHROSCOPY    . TONSILLECTOMY     Social History   Occupational History  . Not on file  Tobacco Use  . Smoking status: Never Smoker  . Smokeless tobacco: Never Used  Substance and Sexual Activity  . Alcohol use: No  . Drug use: No  . Sexual activity: Not on file

## 2017-03-25 ENCOUNTER — Ambulatory Visit: Payer: BC Managed Care – PPO

## 2017-03-27 ENCOUNTER — Ambulatory Visit: Payer: BC Managed Care – PPO

## 2017-03-31 ENCOUNTER — Ambulatory Visit: Payer: BC Managed Care – PPO

## 2017-03-31 DIAGNOSIS — R293 Abnormal posture: Secondary | ICD-10-CM

## 2017-03-31 DIAGNOSIS — G8929 Other chronic pain: Secondary | ICD-10-CM

## 2017-03-31 DIAGNOSIS — M25612 Stiffness of left shoulder, not elsewhere classified: Secondary | ICD-10-CM

## 2017-03-31 DIAGNOSIS — M25512 Pain in left shoulder: Principal | ICD-10-CM

## 2017-03-31 DIAGNOSIS — M6281 Muscle weakness (generalized): Secondary | ICD-10-CM

## 2017-03-31 NOTE — Patient Instructions (Signed)

## 2017-03-31 NOTE — Therapy (Signed)
[facility] [address]  [address] [place], [place], [address] Phone: [phone]   Fax:  [phone]  Physical Therapy Treatment  Patient Details  Name: Megan Mclean MRN: [id number] Date of Birth: 04/14/1965 Referring Provider: Anthony SarPeter Whifield, MD   Encounter Date: 03/31/2017  PT End of Session - 03/31/17 1651    Visit Number  3    Number of Visits  16    Date for PT Re-Evaluation  05/09/17    Authorization Type  State BCBS & Medicaid    PT Start Time  1523 pt. arrived late     PT Stop Time  1603    PT Time Calculation (min)  40 min    Activity Tolerance  Patient limited by pain    Behavior During Therapy  --       No past medical history on file.  Past Surgical History:  Procedure Laterality Date  . CESAREAN SECTION    . SHOULDER ARTHROSCOPY    . TONSILLECTOMY      There were no vitals filed for this visit.  Subjective Assessment - 03/31/17 1634    Subjective  Pt. reporting feeling a "pop" while reaching overhead to doff clothing earlier today.  Reports general improvement in L shoulder over last few weeks.      Pertinent History  02/20/17 - L shoulder arthroscopy: Distal clavicle resection and partial acromioplasty left shoulder; mini open excision of calcific deposit from the supraspinatus with repair of a partial bursal surface tear.    Patient Stated Goals  "more mobility/use of L arm"    Currently in Pain?  Yes    Pain Score  4     Pain Location  Shoulder    Pain Orientation  Left;Anterior    Pain Descriptors / Indicators  Sharp    Pain Type  Acute pain    Pain Radiating Towards  shooting into L neck     Pain Onset  More than a month ago    Pain Frequency  Constant    Aggravating Factors   overhead     Pain Relieving Factors  Ibuprofen     Effect of Pain on Daily Activities  limits reaching overhead     Multiple Pain Sites  No                      [facility] Adult PT Treatment/Exercise -  03/31/17 1812      Shoulder Exercises: Supine   External Rotation  Left;AAROM;10 reps    Flexion  Left;AAROM;10 reps    Flexion Limitations  wand      Shoulder Exercises: Stretch   Other Shoulder Stretches  Seated L flexion stretch with red ball roll out 10 x 5"     Other Shoulder Stretches  L posterior shoulder stretch to pt. tolerance x 20 Orient       Modalities   Modalities  Iontophoresis      Iontophoresis   Type of Iontophoresis  Dexamethasone    Location  L anterior shoulder/L biceps     Dose  1.0 mL, 5880mA-min     Time  4-6 hour wear time       Manual Therapy   Manual Therapy  Soft tissue mobilization;Passive ROM;Myofascial release    Manual therapy comments  supine     Soft tissue mobilization  STM to L biceps, pec, anterior, posterior shoulder to reduce guarding     Myofascial Release  TPR  to L proximal biceps and L lateral pecs; good tolerance     Passive ROM  L shoulder PROM all dir - focusing on ER, abduction, flexion               PT Short Term Goals - 03/18/17 0940      PT SHORT TERM GOAL #1   Title  Independent with initial HEP    Status  On-going      PT SHORT TERM GOAL #2   Title  Pt will verbalize understanding of neutral spine and shoudler posture to allow improved glenohumeral mechanics    Status  On-going      PT SHORT TERM GOAL #3   Title  L shoudler PROM w/in 10-15 dg of R shoulder AROM    Status  On-going        PT Long Term Goals - 03/18/17 0940      PT LONG TERM GOAL #1   Title  Independent with ongoing HEP +/- gym program    Status  On-going      PT LONG TERM GOAL #2   Title  L shoulder AROM WFL w/o increased pain     Status  On-going      PT LONG TERM GOAL #3   Title  L shoulder strength >/= 4-/5 for improved functional use of L UE    Status  On-going      PT LONG TERM GOAL #4   Title  Pt will report ability to complete ADLs, light household chores and typical job tasks w/o limitation due to L shoulder pain, LOM or weakness     Status  On-going            Plan - 03/31/17 1654    Clinical Impression Statement  Megan Mclean seen to start treatment today reporting "popping" sensation and increased pain after donning/doffing shirt earlier today.  Pt. assessed by PT today and demonstrating no abnormal symptoms aside from increased tension and subjective reports of increased L shoulder pain at rest.  STM to L shoulder complex today with palpable "muscular knotting" at proximal biceps and L lateral pec.  pt. reporting her L shoulder ROM had been improving until today.  Ended treatment with application of iontophoresis to L shoulder over area of most tenderness as MD signed ionto order.  Pt. verbalized understanding of ionto proper wear-time and precautions and contraindications.  Will monitor response and continue to progress toward goals in upcoming visits.      Clinical Impairments Affecting Rehab Potential  chronic L shoudler pain; h/o L shoulder adhesive capsulitis ~1 yr ago    PT Treatment/Interventions  Patient/family education;ADLs/Self Care Home Management;Neuromuscular re-education;Therapeutic exercise;Therapeutic activities;Manual techniques;Passive range of motion;Scar mobilization;Taping;Dry needling;Electrical Stimulation;Moist Heat;Cryotherapy;Vasopneumatic Device;Ultrasound;Iontophoresis 4mg /ml Dexamethasone    Consulted and Agree with Plan of Care  Patient       Patient will benefit from skilled therapeutic intervention in order to improve the following deficits and impairments:  Pain, Decreased range of motion, Decreased strength, Impaired flexibility, Increased muscle spasms, Decreased scar mobility, Postural dysfunction, Decreased activity tolerance, Impaired UE functional use  Visit Diagnosis: Chronic left shoulder pain  Stiffness of left shoulder, not elsewhere classified  Muscle weakness (generalized)  Abnormal posture     Problem List There are no active problems to display for this  patient.   Kermit Balo, PTA 03/31/17 6:27 PM  [facility] [address]  [address] [place], [place], [address] Phone: [phone]  Fax:  [phone]  Name: Megan Mclean MRN: [id number] Date of Birth: 11-Dec-1965

## 2017-04-01 ENCOUNTER — Ambulatory Visit: Payer: BC Managed Care – PPO | Admitting: Physical Therapy

## 2017-04-03 ENCOUNTER — Ambulatory Visit: Payer: BC Managed Care – PPO | Admitting: Physical Therapy

## 2017-04-04 ENCOUNTER — Ambulatory Visit (INDEPENDENT_AMBULATORY_CARE_PROVIDER_SITE_OTHER): Payer: BC Managed Care – PPO | Admitting: Orthopaedic Surgery

## 2017-04-09 ENCOUNTER — Ambulatory Visit: Payer: BC Managed Care – PPO | Attending: Orthopaedic Surgery

## 2017-04-09 DIAGNOSIS — M25512 Pain in left shoulder: Secondary | ICD-10-CM | POA: Diagnosis not present

## 2017-04-09 DIAGNOSIS — M6281 Muscle weakness (generalized): Secondary | ICD-10-CM | POA: Diagnosis present

## 2017-04-09 DIAGNOSIS — G8929 Other chronic pain: Secondary | ICD-10-CM | POA: Diagnosis present

## 2017-04-09 DIAGNOSIS — M25612 Stiffness of left shoulder, not elsewhere classified: Secondary | ICD-10-CM | POA: Diagnosis present

## 2017-04-09 DIAGNOSIS — R293 Abnormal posture: Secondary | ICD-10-CM | POA: Insufficient documentation

## 2017-04-09 NOTE — Therapy (Addendum)
[facility] [address]  [place], [place], [address] Phone: [phone]   Fax:  [phone]  Physical Therapy Treatment  Patient Details  Name: Megan Mclean MRN: [id number] Date of Birth: May 15, 1965 Referring Provider: Cyndee Brightly, MD   Encounter Date: 04/09/2017  PT End of Session - 04/09/17 1630    Visit Number  4    Number of Visits  16    Date for PT Re-Evaluation  05/09/17    Authorization Type  State BCBS & Medicaid    PT Start Time  1623    PT Stop Time  1701    PT Time Calculation (min)  38 min    Activity Tolerance  Patient limited by pain       No past medical history on file.  Past Surgical History:  Procedure Laterality Date  . CESAREAN SECTION    . SHOULDER ARTHROSCOPY    . TONSILLECTOMY      There were no vitals filed for this visit.  Subjective Assessment - 04/09/17 1625    Subjective  Pt. admits to, "not performing HEP at all", today.      Pertinent History  02/20/17 - L shoulder arthroscopy: Distal clavicle resection and partial acromioplasty left shoulder; mini open excision of calcific deposit from the supraspinatus with repair of a partial bursal surface tear.    Patient Stated Goals  "more mobility/use of L arm"    Currently in Pain?  Yes    Pain Score  5     Pain Location  Shoulder    Pain Orientation  Left    Pain Descriptors / Indicators  Sharp    Pain Type  Acute pain    Pain Radiating Towards  shotting into L neck     Pain Onset  More than a month ago    Pain Frequency  Constant    Aggravating Factors   overhead     Effect of Pain on Daily Activities  Limits overhead motion     Multiple Pain Sites  No         [facility] PT Assessment - 04/09/17 1646      AROM   Right Shoulder Flexion  170 Degrees    Right Shoulder ABduction  176 Degrees    Left Shoulder Flexion  120 Degrees    Left Shoulder ABduction  110 Degrees    Left Shoulder Internal Rotation  -- FIR to bra  strap    Left Shoulder External Rotation  -- FER to T1      PROM   PROM Assessment Site  Shoulder    Right/Left Shoulder  Left    Left Shoulder Flexion  126 Degrees    Left Shoulder ABduction  116 Degrees    Left Shoulder Internal Rotation  80 Degrees    Left Shoulder External Rotation  55 Degrees      Strength   Right/Left Shoulder  Right;Left    Right Shoulder Flexion  4+/5    Right Shoulder ABduction  4/5    Right Shoulder Internal Rotation  4/5    Right Shoulder External Rotation  4/5    Left Shoulder Flexion  4-/5    Left Shoulder Extension  --    Left Shoulder ABduction  3+/5    Left Shoulder Internal Rotation  4/5    Left Shoulder External Rotation  3+/5  [place] Adult PT Treatment/Exercise - 04/09/17 1802      Self-Care   Self-Care  Other Self-Care Comments    Other Self-Care Comments   Discussed need for consistent performance of HEP for improved ROM with rationale provided to pt. for need to continue to challange ROM with wand activities to avoid loss of motion; discussed need for proper upright/retracted sitting posture with rationale provided as to long-term development of "tightness" with forward "slumped" posture      Shoulder Exercises: Supine   Other Supine Exercises  L shoulder flexion AAROM with self-assist with R UE 3" x 10 reps       Shoulder Exercises: Seated   External Rotation  Left;AAROM;10 reps cueing required to avoid painful end range and for technique    External Rotation Limitations  wand       Shoulder Exercises: ROM/Strengthening   UBE (Upper Arm Bike)  UBE: lvl 1.0, 2 min forwards/2 min backwards  able to perform pain free    Wall Wash  flexion x 10 reps  to tolerance       Manual Therapy   Manual Therapy  Soft tissue mobilization;Passive ROM;Joint mobilization    Manual therapy comments  supine     Joint Mobilization  L shoulder A/P, inferior mobs grade III for improved ROM     Soft tissue mobilization  STM to L  Pec, anterior, posterior shoulder to reduce guarding     Passive ROM  L shoulder PROM all dir - focusing on ER, abduction, flexion             PT Education - 04/09/17 1706    Education provided  Yes    Education Details  Wall wash flexion     Person(s) Educated  Patient    Methods  Explanation;Verbal cues;Handout    Comprehension  Verbalized understanding;Verbal cues required;Need further instruction       PT Short Term Goals - 04/09/17 1816      PT SHORT TERM GOAL #1   Title  Independent with initial HEP    Status  On-going      PT SHORT TERM GOAL #2   Title  Pt will verbalize understanding of neutral spine and shoudler posture to allow improved glenohumeral mechanics    Status  Achieved      PT SHORT TERM GOAL #3   Title  L shoudler PROM w/in 10-15 dg of R shoulder AROM    Status  On-going        PT Long Term Goals - 04/09/17 1656      PT LONG TERM GOAL #1   Title  Independent with ongoing HEP +/- gym program    Status  On-going      PT LONG TERM GOAL #2   Title  L shoulder AROM WFL w/o increased pain     Status  On-going      PT LONG TERM GOAL #3   Title  L shoulder strength >/= 4-/5 for improved functional use of L UE    Status  Partially Met      PT LONG TERM GOAL #4   Title  Pt will report ability to complete ADLs, light household chores and typical job tasks w/o limitation due to L shoulder pain, LOM or weakness    Status  On-going Pt. reporting she is unable to move her furniture and avoids most activities around home, can't reach to top cabinets  Plan - 04/09/17 1706    Clinical Impression Statement  Pt. returning to therapy today after nine-day absence and admitting to, "not performing HEP at all".  Pt. strongly encouraged today to perform HEP at home for improved ROM.  Pt. able to demo good improvement in L shoulder PROM/AROM today however still with pain at end ranges of flexion, abduction, ER with ER ROM most limited.  Pt. very  talkative and easily distractible in session today requiring therapist prompting to stay on task and complete all activities.  Pt. to see MD for f/u on 2.11.  HEP updated today with flexion wall wash for continued focus on improving overhead ROM.  Will continue to monitor adherence to HEP and progress toward goals in coming visits.      Clinical Impairments Affecting Rehab Potential  chronic L shoudler pain; h/o L shoulder adhesive capsulitis ~1 yr ago    PT Treatment/Interventions  Patient/family education;ADLs/Self Care Home Management;Neuromuscular re-education;Therapeutic exercise;Therapeutic activities;Manual techniques;Passive range of motion;Scar mobilization;Taping;Dry needling;Electrical Stimulation;Moist Heat;Cryotherapy;Vasopneumatic Device;Ultrasound;Iontophoresis 59m/ml Dexamethasone    Consulted and Agree with Plan of Care  Patient       Patient will benefit from skilled therapeutic intervention in order to improve the following deficits and impairments:  Pain, Decreased range of motion, Decreased strength, Impaired flexibility, Increased muscle spasms, Decreased scar mobility, Postural dysfunction, Decreased activity tolerance, Impaired UE functional use  Visit Diagnosis: Chronic left shoulder pain  Stiffness of left shoulder, not elsewhere classified  Muscle weakness (generalized)  Abnormal posture     Problem List There are no active problems to display for this patient.   MBess Harvest PTA 04/09/17 6:19 PM  [facility] [address] [place] [address]Phone: [phone]  Fax:  [phone] Name: MGianella ChismarMRN: [id number] of Birth: 1Aug 27, 1967 PHYSICAL THERAPY DISCHARGE SUMMARY  Visits from Start of Care: 4  Current functional level related to goals / functional outcomes:   Unable to assess status at discharge as pt failed to return for any further visits. Refer to above  clinical impression for status as of last visit.   Remaining deficits:   As above.   Education / Equipment:   HEP  Plan: Patient agrees to discharge.  Patient goals were met. Patient is being discharged due to not returning since the last visit.  ?????    JPercival Spanish PT, MPT 06/19/17, 2:43 PM  [facility] [place] [address]Phone: [phone]  Fax:  [phone]

## 2017-04-14 ENCOUNTER — Ambulatory Visit (INDEPENDENT_AMBULATORY_CARE_PROVIDER_SITE_OTHER): Payer: BC Managed Care – PPO | Admitting: Orthopaedic Surgery

## 2017-04-17 ENCOUNTER — Ambulatory Visit: Payer: BC Managed Care – PPO | Admitting: Physical Therapy

## 2017-04-17 ENCOUNTER — Encounter (INDEPENDENT_AMBULATORY_CARE_PROVIDER_SITE_OTHER): Payer: Self-pay | Admitting: Orthopaedic Surgery

## 2017-04-17 ENCOUNTER — Ambulatory Visit (INDEPENDENT_AMBULATORY_CARE_PROVIDER_SITE_OTHER): Payer: BC Managed Care – PPO | Admitting: Orthopaedic Surgery

## 2017-04-17 DIAGNOSIS — G8929 Other chronic pain: Secondary | ICD-10-CM

## 2017-04-17 DIAGNOSIS — M25512 Pain in left shoulder: Secondary | ICD-10-CM

## 2017-04-17 NOTE — Progress Notes (Signed)
   Office Visit Note   Patient: Megan Mclean           Date of Birth: 02/26/1966           MRN: [id number] Visit Date: 04/17/2017              Requested by: [place], [place], [place] [id number] [place], [place] [address] PCP: Piedad ClimesFulbright, [place] E, [place]   Assessment & Plan: Visit Diagnoses:  1. Chronic left shoulder pain     Plan: Leather weeks status post arthroscopic debridement left shoulder and open excision of calcific tendinitis. Doing quite well. Has completed a course of physical therapy with just about full overhead motion. Plan to see back on an as needed. Urge her to continue with her exercises. She's done very well and does not have any the pain that she had preoperatively  Follow-Up Instructions: Return if symptoms worsen or fail to improve.   Orders:  No orders of the defined types were placed in this encounter.  No orders of the defined types were placed in this encounter.     Procedures: No procedures performed   Clinical Data: No additional findings.   Subjective: Chief Complaint  Patient presents with  . Left Shoulder - Routine Post Op    Megan Mclean is a 52 y o S/P almost 3 months Left shoulder arthroscopy. She does have good ROM  Has completed a course of physical therapy and has regained almost full overhead motion. She notes she does not drink the pain that she had preoperatively. She can now sleep on that side fasten her bra and move her shoulder without any significant pain  HPI  Review of Systems   Objective: Vital Signs: There were no vitals taken for this visit.  Physical Exam  Ortho Exam left shoulder incisions of healed very nicely. No tenderness over the anterior lateral subacromial area. No pain over the acromioclavicular joint. Crossarm test is negative. Full quick overhead motion with very minimal loss of full flexion. I don't even think this is functional. Can scratch her back sooner bra. Her vascular exam intact.  Good strength  Specialty Comments:  No specialty comments available.  Imaging: No results found.   PMFS History: There are no active problems to display for this patient.  History reviewed. No pertinent past medical history.  Family History  Problem Relation Age of Onset  . Cancer Sister   . Osteosarcoma Sister   . Osteosarcoma Son     Past Surgical History:  Procedure Laterality Date  . CESAREAN SECTION    . SHOULDER ARTHROSCOPY    . TONSILLECTOMY     Social History   Occupational History  . Not on file  Tobacco Use  . Smoking status: Never Smoker  . Smokeless tobacco: Never Used  Substance and Sexual Activity  . Alcohol use: No  . Drug use: No  . Sexual activity: Not on file     Valeria BatmanPeter W Jackolyn Geron, MD   Note - This record has been created using [employer] software.  Chart creation errors have been sought, but may not always  have been located. Such creation errors do not reflect on  the standard of medical care.

## 2017-04-17 NOTE — Progress Notes (Deleted)
   Office Visit Note   Patient: Megan Mclean           Date of Birth: 07/11/1965           MRN: [id number] Visit Date: 04/17/2017              Requested by: [place], [place], [place] [id number] [place], [place] [address] PCP: Piedad ClimesFulbright, [place], [place]   Assessment & Plan: Visit Diagnoses: No diagnosis found.  Plan: ***  Follow-Up Instructions: No Follow-up on file.   Orders:  No orders of the defined types were placed in this encounter.  No orders of the defined types were placed in this encounter.     Procedures: No procedures performed   Clinical Data: No additional findings.   Subjective: Chief Complaint  Patient presents with  . Left Shoulder - Routine Post Op    Megan Mclean is a 52 y o S/P almost 3 months Left shoulder arthroscopy. She does have good ROM    HPI  Review of Systems  Constitutional: Negative for chills, fatigue and fever.  HENT: Positive for sinus pressure and sinus pain. Negative for hearing loss and tinnitus.   Eyes: Negative for itching.  Respiratory: Negative for chest tightness and shortness of breath.   Cardiovascular: Negative for chest pain, palpitations and leg swelling.  Gastrointestinal: Negative for blood in stool, constipation and diarrhea.  Endocrine: Negative for polyuria.  Genitourinary: Negative for dysuria.  Musculoskeletal: Negative for back pain, joint swelling, neck pain and neck stiffness.  Allergic/Immunologic: Negative for immunocompromised state.  Neurological: Negative for dizziness, numbness and headaches.  Hematological: Does not bruise/bleed easily.  Psychiatric/Behavioral: Positive for sleep disturbance. The patient is not nervous/anxious.      Objective: Vital Signs: There were no vitals taken for this visit.  Physical Exam  Ortho Exam  Specialty Comments:  No specialty comments available.  Imaging: No results found.   PMFS History: There are no active problems to display for  this patient.  No past medical history on file.  Family History  Problem Relation Age of Onset  . Cancer Sister   . Osteosarcoma Sister   . Osteosarcoma Son     Past Surgical History:  Procedure Laterality Date  . CESAREAN SECTION    . SHOULDER ARTHROSCOPY    . TONSILLECTOMY     Social History   Occupational History  . Not on file  Tobacco Use  . Smoking status: Never Smoker  . Smokeless tobacco: Never Used  Substance and Sexual Activity  . Alcohol use: No  . Drug use: No  . Sexual activity: Not on file

## 2019-06-20 IMAGING — MR MR SHOULDER*L* W/O CM
4 of 5 series · 26 of 40 positions shown · non-contrast
Comparison: None.

CLINICAL DATA: Left shoulder pain and limited range of motion for 2
years.

EXAM:
MRI OF THE LEFT SHOULDER WITHOUT CONTRAST
TECHNIQUE: Multiplanar, multisequence MR imaging of the shoulder was performed.
No intravenous contrast was administered.

[Series 3: T2 fat-sat · axial · 4.0mm · 0.25mm/px · z∈[-56,+9]mm · 9 of 17 slices shown (1 of 3)]
[im 1/17]
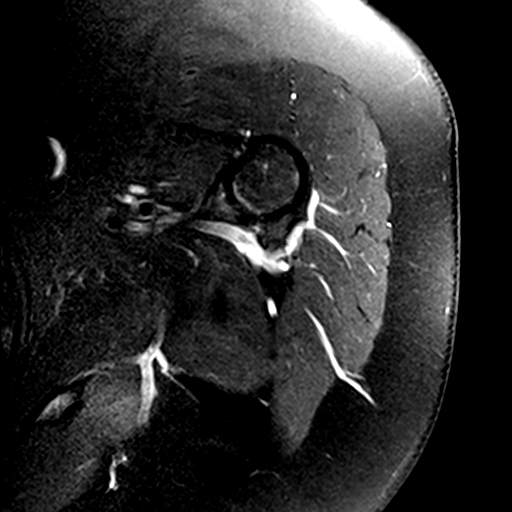
[im 3/17]
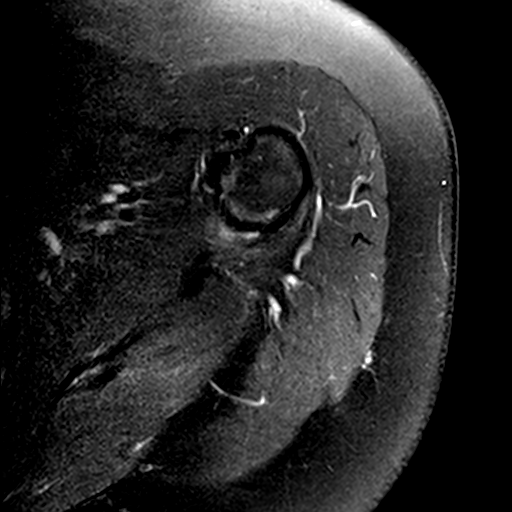
[im 5/17]
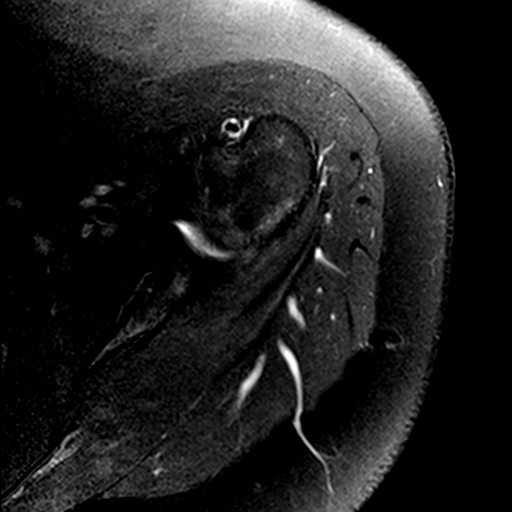
[im 7/17]
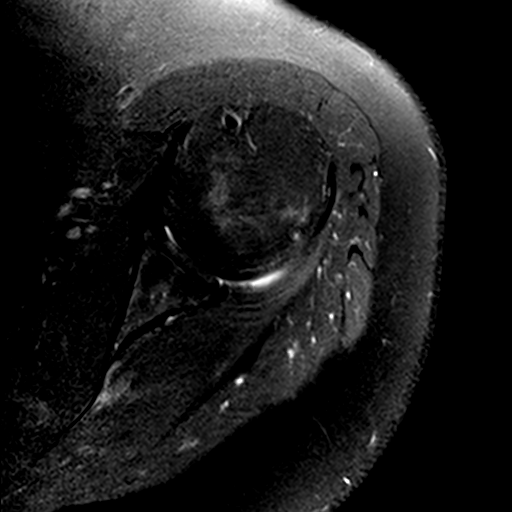
[im 9/17]
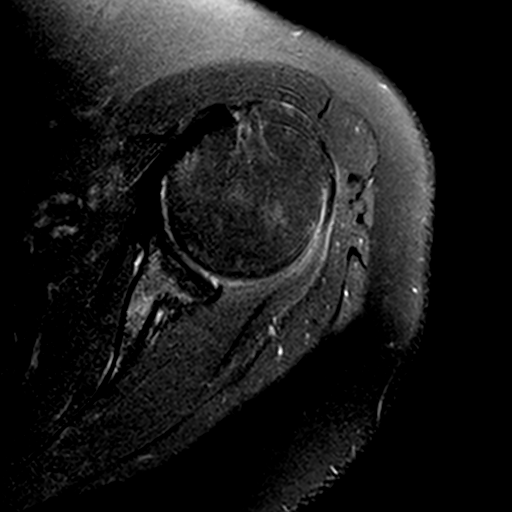
[im 11/17]
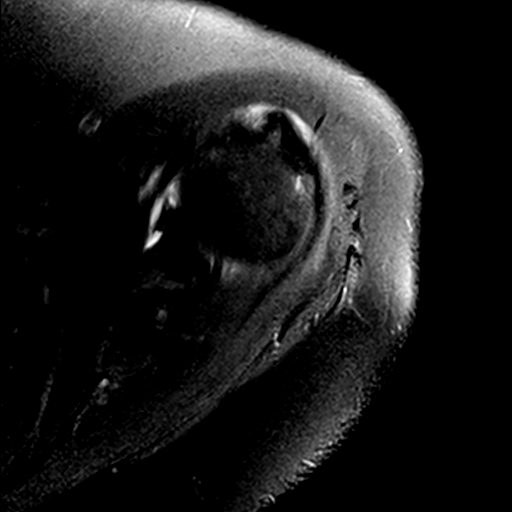
[im 13/17]
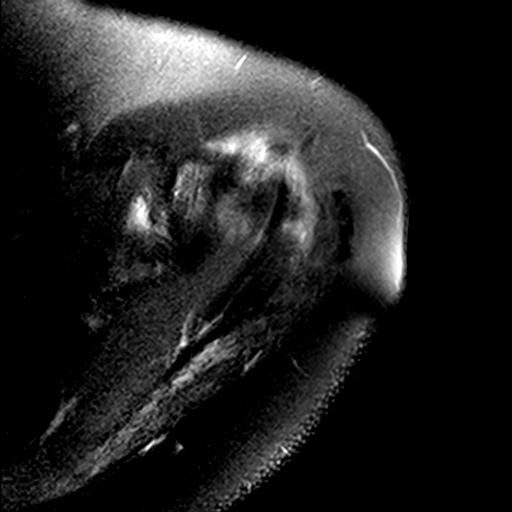
[im 15/17]
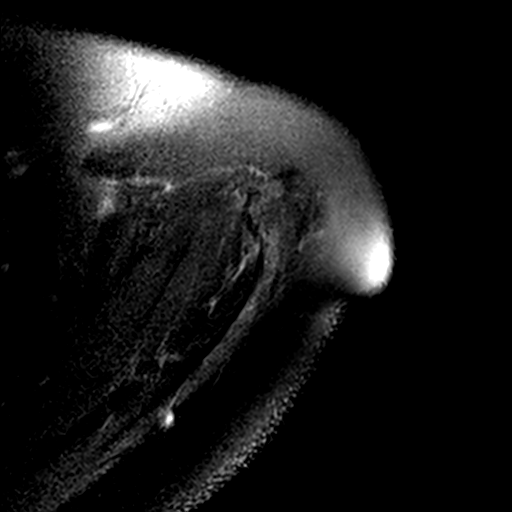
[im 17/17]
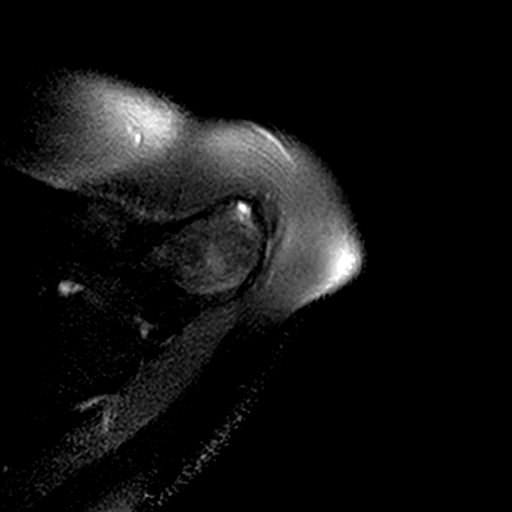

[Series 4: T2 fat-sat · oblique · 4.0mm · 0.55mm/px · 7 of 17 slices shown (2 of 3)]
[im 1/17]
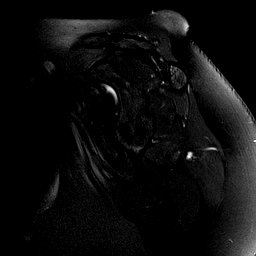
[im 3/17]
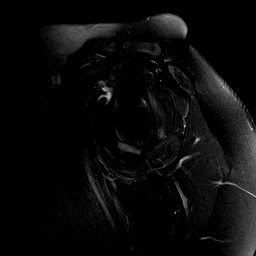
[im 5/17]
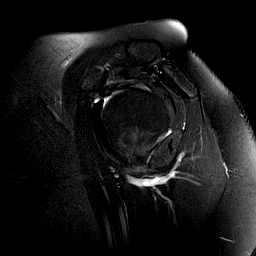
[im 7/17]
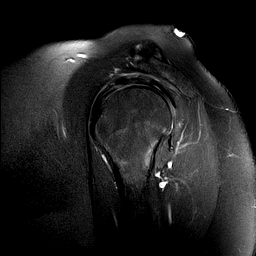
[im 9/17]
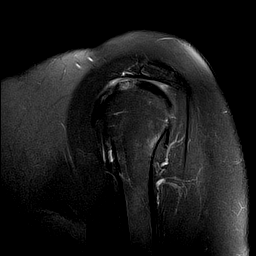
[im 11/17]
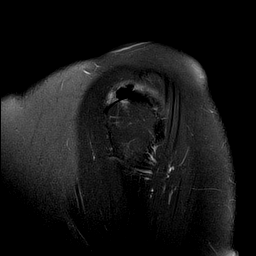
[im 15/17]
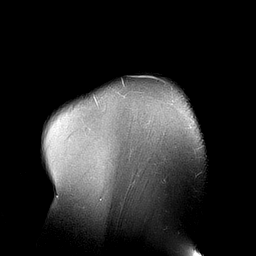

[Series 6: T2 fat-sat · oblique · 4.0mm · 0.55mm/px · 3 of 15 slices shown (3 of 3)]
[im 3/15]
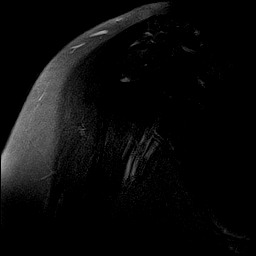
[im 8/15]
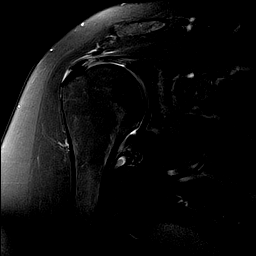
[im 12/15]
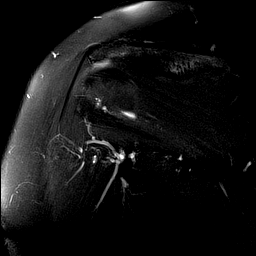

[Series 7: PD · oblique · 4.0mm · 0.27mm/px · 7 of 15 slices shown]
[im 1/15]
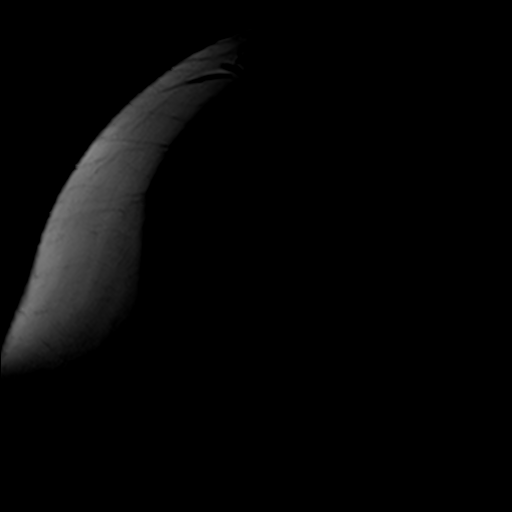
[im 3/15]
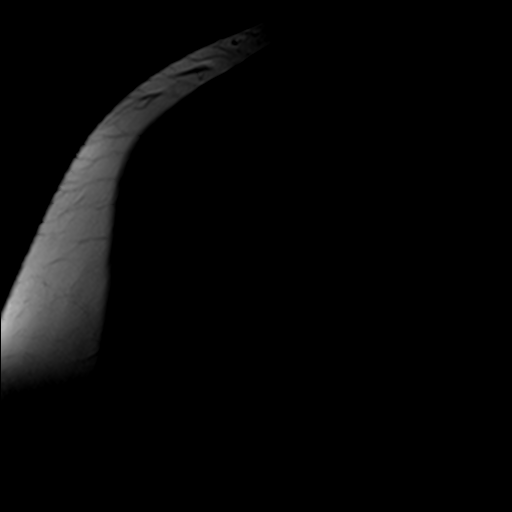
[im 5/15]
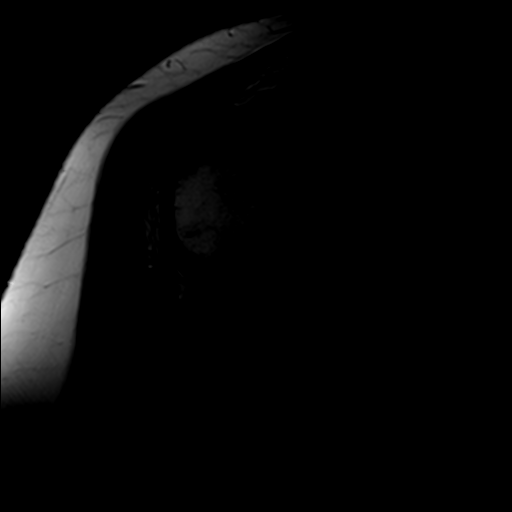
[im 8/15]
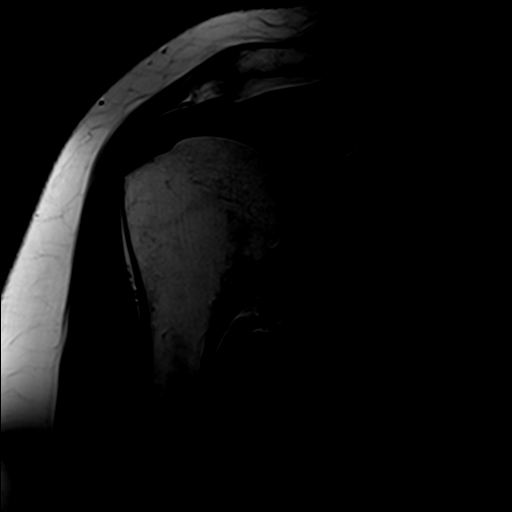
[im 10/15]
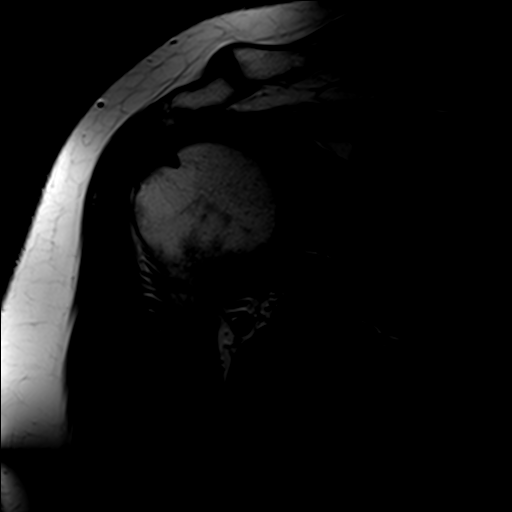
[im 12/15]
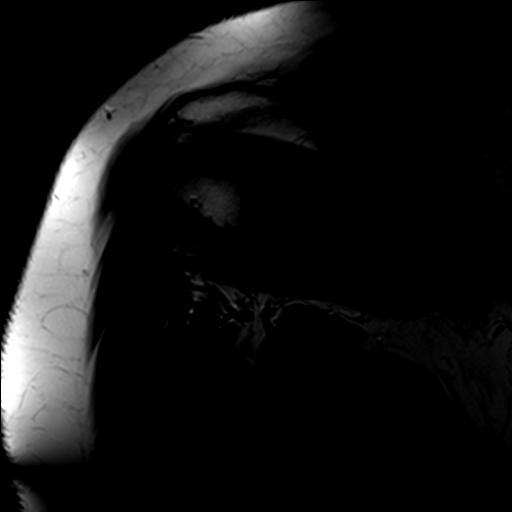
[im 15/15]
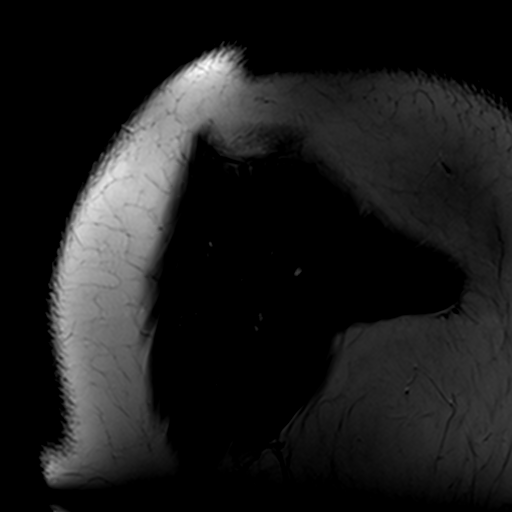

[26 of 40 positions shown; findings below may reference images not displayed]

FINDINGS: Rotator cuff: High-grade partial thickness bursal surface tearing of
the distal posterior supraspinatus tendon with a 1.0 by 0.9 by
cm low signal intensity adjacent to the tear along the bursal margin
of the tendon favoring calcific tendinopathy. Moderate adjacent
edema and expansion of the supraspinatus tendon compatible with
calcific tendinopathy. The infraspinatus and subscapularis tendons
appear intact.

Muscles:  Unremarkable

Biceps long head:  Mild tendinopathy of the intra-articular segment.

Acromioclavicular Joint: Mild spurring and minimal subcortical
marrow edema. Type II acromion. Small but abnormal amount of fluid
in the subacromial subdeltoid bursa.

Glenohumeral Joint: Mild degenerative chondral thinning and mild
spurring.

Labrum:  Grossly unremarkable

Bones: No significant extra-articular osseous abnormalities
identified.

Other: No supplemental non-categorized findings.
IMPRESSION: 1. At least moderate calcific supraspinatus tendinopathy with
high-grade partial thickness bursal surface tearing.
2. Subacromial subdeltoid bursitis.
3. Mild biceps tendinopathy.
4. Mild degenerative AC joint arthropathy and mild degenerative
glenohumeral arthropathy.

## 2021-12-23 ENCOUNTER — Emergency Department (HOSPITAL_BASED_OUTPATIENT_CLINIC_OR_DEPARTMENT_OTHER): Payer: BC Managed Care – PPO

## 2021-12-23 ENCOUNTER — Encounter (HOSPITAL_BASED_OUTPATIENT_CLINIC_OR_DEPARTMENT_OTHER): Payer: Self-pay | Admitting: Emergency Medicine

## 2021-12-23 ENCOUNTER — Emergency Department (HOSPITAL_BASED_OUTPATIENT_CLINIC_OR_DEPARTMENT_OTHER)
Admission: EM | Admit: 2021-12-23 | Discharge: 2021-12-24 | Disposition: A | Discharge status: Home / Self Care | Payer: BC Managed Care – PPO | Attending: Emergency Medicine | Admitting: Emergency Medicine

## 2021-12-23 ENCOUNTER — Other Ambulatory Visit: Payer: Self-pay

## 2021-12-23 DIAGNOSIS — R1012 Left upper quadrant pain: Secondary | ICD-10-CM

## 2021-12-23 DIAGNOSIS — R109 Unspecified abdominal pain: Secondary | ICD-10-CM | POA: Insufficient documentation

## 2021-12-23 DIAGNOSIS — M545 Low back pain, unspecified: Secondary | ICD-10-CM | POA: Diagnosis not present

## 2021-12-23 DIAGNOSIS — R10A2 Flank pain, left side: Secondary | ICD-10-CM

## 2021-12-23 DIAGNOSIS — R1013 Epigastric pain: Secondary | ICD-10-CM | POA: Insufficient documentation

## 2021-12-23 LAB — CBC WITH DIFFERENTIAL/PLATELET
Abs Immature Granulocytes: 0.01 10*3/uL (ref 0.00–0.07)
Basophils Absolute: 0.1 10*3/uL (ref 0.0–0.1)
Basophils Relative: 1 %
Eosinophils Absolute: 0.2 10*3/uL (ref 0.0–0.5)
Eosinophils Relative: 3 %
HCT: 40 % (ref 36.0–46.0)
Hemoglobin: 13.4 g/dL (ref 12.0–15.0)
Immature Granulocytes: 0 %
Lymphocytes Relative: 35 %
Lymphs Abs: 2.4 10*3/uL (ref 0.7–4.0)
MCH: 27.9 pg (ref 26.0–34.0)
MCHC: 33.5 g/dL (ref 30.0–36.0)
MCV: 83.3 fL (ref 80.0–100.0)
Monocytes Absolute: 0.5 10*3/uL (ref 0.1–1.0)
Monocytes Relative: 7 %
Neutro Abs: 3.8 10*3/uL (ref 1.7–7.7)
Neutrophils Relative %: 54 %
Platelets: 278 10*3/uL (ref 150–400)
RBC: 4.8 MIL/uL (ref 3.87–5.11)
RDW: 12.3 % (ref 11.5–15.5)
WBC: 6.9 10*3/uL (ref 4.0–10.5)
nRBC: 0 % (ref 0.0–0.2)

## 2021-12-23 LAB — COMPREHENSIVE METABOLIC PANEL
ALT: 31 U/L (ref 0–44)
AST: 25 U/L (ref 15–41)
Albumin: 4.1 g/dL (ref 3.5–5.0)
Alkaline Phosphatase: 55 U/L (ref 38–126)
Anion gap: 4 — ABNORMAL LOW (ref 5–15)
BUN: 19 mg/dL (ref 6–20)
CO2: 25 mmol/L (ref 22–32)
Calcium: 9 mg/dL (ref 8.9–10.3)
Chloride: 111 mmol/L (ref 98–111)
Creatinine, Ser: 0.76 mg/dL (ref 0.44–1.00)
GFR, Estimated: >60 mL/min (ref >60)
Glucose, Bld: 96 mg/dL (ref 70–99)
Potassium: 3.7 mmol/L (ref 3.5–5.1)
Sodium: 140 mmol/L (ref 135–145)
Total Bilirubin: 1.4 mg/dL — ABNORMAL HIGH (ref 0.3–1.2)
Total Protein: 6.9 g/dL (ref 6.5–8.1)

## 2021-12-23 LAB — LACTIC ACID, PLASMA: Lactic Acid, Venous: 0.7 mmol/L (ref 0.5–1.9)

## 2021-12-23 LAB — LIPASE, BLOOD: Lipase: 35 U/L (ref 11–51)

## 2021-12-23 LAB — MAGNESIUM: Magnesium: 2.1 mg/dL (ref 1.7–2.4)

## 2021-12-23 MED ORDER — IOHEXOL 300 MG/ML  SOLN
100.0000 mL | Freq: Once | INTRAMUSCULAR | Status: AC | PRN
  Administered 2021-12-23: 100 mL via INTRAVENOUS

## 2021-12-23 MED ORDER — FENTANYL CITRATE PF 50 MCG/ML IJ SOSY
50.0000 ug | PREFILLED_SYRINGE | Freq: Once | INTRAMUSCULAR | Status: AC
  Administered 2021-12-23: 50 ug via INTRAVENOUS
  Filled 2021-12-23: qty 1

## 2021-12-23 MED ORDER — ONDANSETRON HCL 4 MG/2ML IJ SOLN
4.0000 mg | Freq: Once | INTRAMUSCULAR | Status: AC
  Administered 2021-12-23: 4 mg via INTRAVENOUS
  Filled 2021-12-23: qty 2

## 2021-12-23 NOTE — ED Provider Notes (Signed)
[facility] Provider Note   CSN: [id number] Arrival date & time: 12/23/21  1913     History  Chief Complaint  Patient presents with   Back Pain    Megan Mclean is a 56 y.o. female.  The history is provided by the patient and medical records. No language interpreter was used.  Back Pain Location:  Lumbar spine Quality:  Burning, aching, cramping, shooting and stabbing Associated symptoms: abdominal pain   Associated symptoms: no chest pain, no dysuria, no fever, no headaches, no numbness and no weakness   Abdominal Pain Pain location:  R flank, LUQ and epigastric Pain quality: cramping, sharp and stabbing   Pain severity:  Severe Onset quality:  Gradual Duration:  1 day Timing:  Constant Progression:  Waxing and waning Chronicity:  New Context: not trauma   Relieved by:  Nothing Worsened by:  Movement and palpation Ineffective treatments:  None tried Associated symptoms: no belching, no chest pain, no chills, no constipation, no cough, no diarrhea, no dysuria, no fatigue, no fever, no nausea, no shortness of breath and no vomiting        Home Medications Prior to Admission medications   Medication Sig Start Date End Date Taking? Authorizing Provider  amitriptyline (ELAVIL) 10 MG tablet Take 10 mg by mouth. 10/11/16 10/11/17  [provider]  ibuprofen (ADVIL,MOTRIN) 200 MG tablet Take 200 mg by mouth every 6 (six) hours as needed.    [provider]      Allergies    Codeine    Review of Systems   Review of Systems  Constitutional:  Negative for chills, diaphoresis, fatigue and fever.  HENT:  Negative for congestion.   Respiratory:  Negative for cough, chest tightness, shortness of breath and wheezing.   Cardiovascular:  Negative for chest pain.  Gastrointestinal:  Positive for abdominal pain. Negative for constipation, diarrhea, nausea and vomiting.  Genitourinary:  Positive for flank pain. Negative for  dysuria.  Musculoskeletal:  Positive for back pain. Negative for neck pain and neck stiffness.  Skin:  Negative for rash and wound.  Neurological:  Negative for dizziness, weakness, light-headedness, numbness and headaches.  Psychiatric/Behavioral:  Negative for agitation and confusion.   All other systems reviewed and are negative.   Physical Exam Updated Vital Signs BP 123/78 (BP Location: Right Arm)   Pulse 82   Temp 98.2 F (36.8 C) (Oral)   Resp 18   Ht 5\' 4"  (1.626 m)   Wt 73.9 kg   SpO2 97%   BMI 27.98 kg/m  Physical Exam Vitals and nursing note reviewed.  Constitutional:      General: She is not in acute distress.    Appearance: She is well-developed. She is not ill-appearing, toxic-appearing or diaphoretic.  HENT:     Head: Normocephalic and atraumatic.     Nose: No congestion or rhinorrhea.     Mouth/Throat:     Mouth: Mucous membranes are moist.     Pharynx: No oropharyngeal exudate or posterior oropharyngeal erythema.  Eyes:     Extraocular Movements: Extraocular movements intact.     Conjunctiva/sclera: Conjunctivae normal.     Pupils: Pupils are equal, round, and reactive to light.  Cardiovascular:     Rate and Rhythm: Normal rate and regular rhythm.     Heart sounds: No murmur heard. Pulmonary:     Effort: Pulmonary effort is normal. No respiratory distress.     Breath sounds: Normal breath sounds. No wheezing, rhonchi or rales.  Chest:     Chest wall: No tenderness.  Abdominal:     General: Abdomen is flat.     Palpations: Abdomen is soft.     Tenderness: There is no abdominal tenderness. There is no right CVA tenderness, left CVA tenderness, guarding or rebound.  Musculoskeletal:        General: No swelling or tenderness.     Cervical back: Neck supple.  Skin:    General: Skin is warm and dry.     Capillary Refill: Capillary refill takes less than 2 seconds.     Findings: No erythema.  Neurological:     General: No focal deficit present.      Mental Status: She is alert.     Sensory: No sensory deficit.     Motor: No weakness.  Psychiatric:        Mood and Affect: Mood normal.     ED Results / Procedures / Treatments   Labs (all labs ordered are listed, but only abnormal results are displayed) Labs Reviewed  COMPREHENSIVE METABOLIC PANEL - Abnormal; Notable for the following components:      Result Value   Total Bilirubin 1.4 (*)    Anion gap 4 (*)    All other components within normal limits  URINE CULTURE  CBC WITH DIFFERENTIAL/PLATELET  LIPASE, BLOOD  LACTIC ACID, PLASMA  MAGNESIUM  LACTIC ACID, PLASMA  URINALYSIS, ROUTINE W REFLEX MICROSCOPIC    EKG EKG Interpretation  Date/Time:  Sunday December 23 2021 22:34:41 EDT Ventricular Rate:  72 PR Interval:  146 QRS Duration: 90 QT Interval:  416 QTC Calculation: 456 R Axis:   35 Text Interpretation: Sinus rhythm when compared to prior, similar appearance. No STEMI Confirmed by Theda Belfast ([id number]) on 12/23/2021 10:53:23 PM  Radiology No results found.  Procedures Procedures    Medications Ordered in ED Medications  fentaNYL (SUBLIMAZE) injection 50 mcg (50 mcg Intravenous Given 12/23/21 2233)  ondansetron (ZOFRAN) injection 4 mg (4 mg Intravenous Given 12/23/21 2301)  iohexol (OMNIPAQUE) 300 MG/ML solution 100 mL (100 mLs Intravenous Contrast Given 12/23/21 2330)    ED Course/ Medical Decision Making/ A&P                           Medical Decision Making Amount and/or Complexity of Data Reviewed Labs: ordered. Radiology: ordered.  Risk Prescription drug management.    Megan Mclean is a 56 y.o. female with no significant past medical history who presents with severe torso pain.  According to patient, she started a new job where she is doing some lifting and was having some soreness in her hip areas bilaterally but that has resolved.  She reports that this morning after she stood up she had onset of severe pain in her left back and flank  wrapping around towards her abdomen.  She reports that then wraps around to the other side and it is burning, aching, and shooting/stabbing/sharp.  She reports has never had this pain before.  Goes across her lower chest but she is denying other chest pain or shortness of breath.  She reports no nausea or vomiting and denies any urinary symptoms.  Denies any trauma.  Denies any constipation, diarrhea, or vaginal symptoms.  She says her family has had shingles but she denies any rash or history of shingles herself.  She denies any history of pancreatitis for this left upper quadrant pain and does not drink alcohol.  She denies any cough, congestion,  or URI symptoms.  She reports the pain was a 5000 out of 10.   On exam, lungs clear and chest nontender.  Abdomen not exquisitely tender.  No rash seen.  Chest wall nontender.  Lungs clear.  This symptoms are nonpleuritic but it did go across her lower chest.  Good pulses in extremities.  Straight leg raise negative.  Midline back was nontender.  She did have some possible muscle spasm seen on exam.  Clinically I suspect that the patient is new lifting job caused her to have some musculoskeletal pain wrapping around her torso.  We also considered a pain from shingles with out rash yet.  Given the location of the pain being in the left upper quadrant wrapping across, we also considered pancreatitis, diverticulitis, splenic abnormality, or kidney stones.  Due to the degree of the patient's pain, we will get some CT imaging of her abdomen and pelvis and will get a chest x-ray.  We will get screening labs.  We will give her some pain medicine and nausea medicine that she developed nausea here.  Anticipate reassessment after work-up.  Reassessment is reassuring, anticipate likely prescription with Lidoderm patches or muscle relaxant for suspected musculoskeletal pain and PCP follow-up.  Care transferred to oncoming team to await work-up results.         Final  Clinical Impression(s) / ED Diagnoses Final diagnoses:  Left flank pain  Acute bilateral low back pain without sciatica  Left upper quadrant abdominal pain    Clinical Impression: 1. Left flank pain   2. Acute bilateral low back pain without sciatica   3. Left upper quadrant abdominal pain     Disposition: Care transferred to oncoming team to await work-up results.  This note was prepared with assistance of Conservation officer, historic buildings. Occasional wrong-word or sound-a-like substitutions may have occurred due to the inherent limitations of voice recognition software.      Mert Dietrick, Canary Brim, MD 12/23/21 2330

## 2021-12-23 NOTE — ED Notes (Signed)
ED Provider at bedside. 

## 2021-12-23 NOTE — ED Notes (Signed)
Patient transported to CT 

## 2021-12-23 NOTE — ED Triage Notes (Signed)
Pt sts she woke this mornig with pain that "goes all the way across back and I couldn't move"; pt amb to triage; sts pain is shooting

## 2021-12-23 NOTE — ED Notes (Signed)
Pt states " I have been having the worst pain of my life in my left side since 0830 today, I can't take it anymore" pt states reports normal BM today, denies N/V, Denies urinary symptoms States pain worse with movement, denies any injury. Pt resting in the bed.

## 2021-12-24 ENCOUNTER — Encounter (HOSPITAL_BASED_OUTPATIENT_CLINIC_OR_DEPARTMENT_OTHER): Payer: Self-pay | Admitting: Emergency Medicine

## 2021-12-24 MED ORDER — METHOCARBAMOL 500 MG PO TABS: 500.0000 mg | ORAL_TABLET | Freq: Two times a day (BID) | ORAL | 0 refills | Status: AC

## 2021-12-24 MED ORDER — METHOCARBAMOL 500 MG PO TABS
500.0000 mg | ORAL_TABLET | Freq: Once | ORAL | Status: AC
  Administered 2021-12-24: 500 mg via ORAL
  Filled 2021-12-24: qty 1

## 2021-12-24 MED ORDER — KETOROLAC TROMETHAMINE 30 MG/ML IJ SOLN
30.0000 mg | Freq: Once | INTRAMUSCULAR | Status: AC
  Administered 2021-12-24: 30 mg via INTRAVENOUS
  Filled 2021-12-24: qty 1

## 2021-12-24 MED ORDER — NAPROXEN 500 MG PO TABS: 500.0000 mg | ORAL_TABLET | Freq: Two times a day (BID) | ORAL | 0 refills | Status: AC

## 2021-12-24 NOTE — ED Provider Notes (Signed)
2330 Care of the patient assumed at the change of shift. Here for back pain, MSK vs renal colic vs early zoster. She is awaiting CT scan and CXR.   1:00 AM I personally viewed the images from radiology studies and agree with radiologist interpretation: CT is negative for acute finding. 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 Not related to today's visit but will need outpatient evaluation. Patient is aware.   Patient reports improved pain, resting comfortably. Given toradol and robaxin here, plan discharge with Naproxyn, Robaxin and PCP follow up. RTED for any other concerns.     Truddie Hidden, MD 12/24/21 [phone]
